# Patient Record
Sex: Female | Born: 1935 | Race: White | Hispanic: No | Smoking: Never smoker
Health system: Southern US, Community
[De-identification: ages and names within clinical notes are randomized; demographics above are authoritative.]

## PROBLEM LIST (undated history)

## (undated) DIAGNOSIS — I4891 Unspecified atrial fibrillation: Secondary | ICD-10-CM

## (undated) DIAGNOSIS — R609 Edema, unspecified: Secondary | ICD-10-CM

## (undated) DIAGNOSIS — F0390 Unspecified dementia without behavioral disturbance: Secondary | ICD-10-CM

## (undated) DIAGNOSIS — K219 Gastro-esophageal reflux disease without esophagitis: Secondary | ICD-10-CM

## (undated) DIAGNOSIS — E119 Type 2 diabetes mellitus without complications: Secondary | ICD-10-CM

## (undated) DIAGNOSIS — I10 Essential (primary) hypertension: Secondary | ICD-10-CM

## (undated) DIAGNOSIS — E785 Hyperlipidemia, unspecified: Secondary | ICD-10-CM

## (undated) HISTORY — PX: NO PAST SURGERIES: SHX2092

---

## 2003-10-22 ENCOUNTER — Emergency Department: Payer: Self-pay | Admitting: Emergency Medicine

## 2004-04-18 ENCOUNTER — Ambulatory Visit: Payer: Self-pay | Admitting: Internal Medicine

## 2004-04-23 ENCOUNTER — Ambulatory Visit: Payer: Self-pay | Admitting: Internal Medicine

## 2004-05-12 ENCOUNTER — Ambulatory Visit: Payer: Self-pay | Admitting: Family Medicine

## 2007-01-04 ENCOUNTER — Ambulatory Visit: Payer: Self-pay | Admitting: Internal Medicine

## 2007-08-02 ENCOUNTER — Ambulatory Visit: Payer: Self-pay | Admitting: Specialist

## 2009-01-17 ENCOUNTER — Ambulatory Visit: Payer: Self-pay | Admitting: Family Medicine

## 2010-03-14 ENCOUNTER — Emergency Department: Payer: Self-pay | Admitting: Emergency Medicine

## 2010-08-01 ENCOUNTER — Emergency Department: Payer: Self-pay | Admitting: Emergency Medicine

## 2012-02-19 LAB — COMPREHENSIVE METABOLIC PANEL
Albumin: 3.4 g/dL (ref 3.4–5.0)
Chloride: 102 mmol/L (ref 98–107)
Creatinine: 1.13 mg/dL (ref 0.60–1.30)
Glucose: 169 mg/dL — ABNORMAL HIGH (ref 65–99)
Potassium: 4.2 mmol/L (ref 3.5–5.1)
SGPT (ALT): 17 U/L (ref 12–78)
Sodium: 137 mmol/L (ref 136–145)

## 2012-02-19 LAB — CBC
HCT: 37.3 % (ref 35.0–47.0)
HGB: 12 g/dL (ref 12.0–16.0)
MCH: 28.3 pg (ref 26.0–34.0)
MCV: 88 fL (ref 80–100)
Platelet: 207 10*3/uL (ref 150–440)
RBC: 4.25 10*6/uL (ref 3.80–5.20)
RDW: 14.3 % (ref 11.5–14.5)
WBC: 10.4 10*3/uL (ref 3.6–11.0)

## 2012-02-19 LAB — CBC WITH DIFFERENTIAL/PLATELET
Basophil %: 0.3 %
Eosinophil #: 0.4 10*3/uL (ref 0.0–0.7)
Eosinophil %: 3.5 %
Lymphocyte #: 1.2 10*3/uL (ref 1.0–3.6)
Lymphocyte %: 11.4 %
Monocyte #: 0.7 x10 3/mm (ref 0.2–0.9)
Monocyte %: 7.2 %
Neutrophil %: 77.6 %

## 2012-02-19 LAB — PROTIME-INR
INR: 1
Prothrombin Time: 13.7 secs (ref 11.5–14.7)

## 2012-02-19 LAB — TROPONIN I: Troponin-I: <0.02 ng/mL

## 2012-02-20 LAB — RAPID URINE DRUG SCREEN, HOSP PERFORMED
Amphetamines, Ur Screen: NEGATIVE (ref ?–1000)
Barbiturates, Ur Screen: NEGATIVE (ref ?–200)
Benzodiazepine, Ur Scrn: NEGATIVE (ref ?–200)
Cannabinoid 50 Ng, Ur ~~LOC~~: NEGATIVE (ref ?–50)
Cocaine Metabolite,Ur ~~LOC~~: NEGATIVE (ref ?–300)
Opiate, Ur Screen: NEGATIVE (ref ?–300)

## 2012-02-20 LAB — URINALYSIS, COMPLETE
Blood: NEGATIVE
Ketone: NEGATIVE
RBC,UR: NONE SEEN /HPF (ref 0–5)
Specific Gravity: 1.01 (ref 1.003–1.030)
Squamous Epithelial: <1

## 2012-02-20 LAB — TROPONIN I
Troponin-I: 0.03 ng/mL
Troponin-I: <0.02 ng/mL

## 2012-02-21 ENCOUNTER — Inpatient Hospital Stay: Payer: Self-pay | Admitting: Internal Medicine

## 2012-02-21 LAB — BASIC METABOLIC PANEL
BUN: 19 mg/dL — ABNORMAL HIGH (ref 7–18)
Chloride: 101 mmol/L (ref 98–107)
Co2: 28 mmol/L (ref 21–32)
EGFR (African American): >60
Glucose: 131 mg/dL — ABNORMAL HIGH (ref 65–99)
Sodium: 136 mmol/L (ref 136–145)

## 2012-02-21 LAB — CBC WITH DIFFERENTIAL/PLATELET
Basophil #: 0.1 10*3/uL (ref 0.0–0.1)
Basophil %: 0.9 %
Eosinophil #: 0.3 10*3/uL (ref 0.0–0.7)
Eosinophil %: 3.9 %
HCT: 35.6 % (ref 35.0–47.0)
HGB: 11.6 g/dL — ABNORMAL LOW (ref 12.0–16.0)
Lymphocyte #: 1 10*3/uL (ref 1.0–3.6)
Lymphocyte %: 11.9 %
MCH: 28.6 pg (ref 26.0–34.0)
MCHC: 32.7 g/dL (ref 32.0–36.0)
MCV: 88 fL (ref 80–100)
Monocyte #: 0.7 x10 3/mm (ref 0.2–0.9)
Neutrophil #: 6.3 10*3/uL (ref 1.4–6.5)
Platelet: 185 10*3/uL (ref 150–440)
RBC: 4.06 10*6/uL (ref 3.80–5.20)
WBC: 8.4 10*3/uL (ref 3.6–11.0)

## 2012-02-21 LAB — LIPID PANEL
Cholesterol: 117 mg/dL (ref 0–200)
HDL Cholesterol: 38 mg/dL — ABNORMAL LOW (ref 40–60)
Ldl Cholesterol, Calc: 58 mg/dL (ref 0–100)

## 2012-02-28 ENCOUNTER — Emergency Department: Payer: Self-pay | Admitting: Emergency Medicine

## 2012-02-28 LAB — URINALYSIS, COMPLETE
Bilirubin,UR: NEGATIVE
Ketone: NEGATIVE
Leukocyte Esterase: NEGATIVE
Nitrite: NEGATIVE
Ph: 6 (ref 4.5–8.0)
Protein: 100
RBC,UR: 7521 /HPF (ref 0–5)
Squamous Epithelial: 11
WBC UR: 90 /HPF (ref 0–5)

## 2012-02-28 LAB — CBC
HCT: 36.7 % (ref 35.0–47.0)
MCH: 29.4 pg (ref 26.0–34.0)
MCHC: 33.9 g/dL (ref 32.0–36.0)
Platelet: 219 10*3/uL (ref 150–440)
RDW: 14.1 % (ref 11.5–14.5)
WBC: 9.5 10*3/uL (ref 3.6–11.0)

## 2012-02-28 LAB — COMPREHENSIVE METABOLIC PANEL
Albumin: 3.3 g/dL — ABNORMAL LOW (ref 3.4–5.0)
Alkaline Phosphatase: 77 U/L (ref 50–136)
BUN: 16 mg/dL (ref 7–18)
Bilirubin,Total: 0.6 mg/dL (ref 0.2–1.0)
Calcium, Total: 8.6 mg/dL (ref 8.5–10.1)
Chloride: 101 mmol/L (ref 98–107)
Osmolality: 279 (ref 275–301)
SGPT (ALT): 22 U/L (ref 12–78)
Sodium: 137 mmol/L (ref 136–145)

## 2012-03-01 LAB — URINE CULTURE

## 2012-11-19 ENCOUNTER — Observation Stay: Payer: Self-pay | Admitting: Internal Medicine

## 2012-11-19 LAB — COMPREHENSIVE METABOLIC PANEL
Albumin: 3.2 g/dL — ABNORMAL LOW (ref 3.4–5.0)
Alkaline Phosphatase: 99 U/L (ref 50–136)
Anion Gap: 6 — ABNORMAL LOW (ref 7–16)
BUN: 22 mg/dL — ABNORMAL HIGH (ref 7–18)
Calcium, Total: 8.5 mg/dL (ref 8.5–10.1)
Chloride: 101 mmol/L (ref 98–107)
Co2: 28 mmol/L (ref 21–32)
Creatinine: 0.98 mg/dL (ref 0.60–1.30)
EGFR (African American): 60 — ABNORMAL LOW
EGFR (Non-African Amer.): 52 — ABNORMAL LOW
Glucose: 297 mg/dL — ABNORMAL HIGH (ref 65–99)
SGOT(AST): 15 U/L (ref 15–37)
Total Protein: 6.6 g/dL (ref 6.4–8.2)

## 2012-11-19 LAB — CBC
HGB: 12.6 g/dL (ref 12.0–16.0)
MCH: 28.7 pg (ref 26.0–34.0)
MCV: 84 fL (ref 80–100)
Platelet: 200 10*3/uL (ref 150–440)
RBC: 4.37 10*6/uL (ref 3.80–5.20)

## 2012-11-19 LAB — TROPONIN I: Troponin-I: 0.02 ng/mL

## 2012-11-20 LAB — CBC WITH DIFFERENTIAL/PLATELET
Basophil #: 0.1 10*3/uL (ref 0.0–0.1)
HCT: 35.4 % (ref 35.0–47.0)
Lymphocyte %: 12.7 %
MCH: 28.8 pg (ref 26.0–34.0)
Monocyte #: 0.6 x10 3/mm (ref 0.2–0.9)
Monocyte %: 7 %
Neutrophil %: 75.6 %
Platelet: 186 10*3/uL (ref 150–440)
RBC: 4.26 10*6/uL (ref 3.80–5.20)
RDW: 14.4 % (ref 11.5–14.5)
WBC: 8.4 10*3/uL (ref 3.6–11.0)

## 2012-11-20 LAB — LIPID PANEL
Cholesterol: 159 mg/dL (ref 0–200)
HDL Cholesterol: 40 mg/dL (ref 40–60)
VLDL Cholesterol, Calc: 24 mg/dL (ref 5–40)

## 2012-11-20 LAB — BASIC METABOLIC PANEL
Anion Gap: 4 — ABNORMAL LOW (ref 7–16)
BUN: 18 mg/dL (ref 7–18)
Calcium, Total: 8.4 mg/dL — ABNORMAL LOW (ref 8.5–10.1)
Creatinine: 0.91 mg/dL (ref 0.60–1.30)
EGFR (African American): >60
EGFR (Non-African Amer.): 56 — ABNORMAL LOW
Osmolality: 282 (ref 275–301)
Sodium: 137 mmol/L (ref 136–145)

## 2012-11-20 LAB — URINALYSIS, COMPLETE
Bilirubin,UR: NEGATIVE
Blood: NEGATIVE
Glucose,UR: >=500 mg/dL (ref 0–75)
Ketone: NEGATIVE
Specific Gravity: 1.013 (ref 1.003–1.030)
Squamous Epithelial: <1
WBC UR: 9 /HPF (ref 0–5)

## 2012-11-20 LAB — MAGNESIUM
Magnesium: 1.4 mg/dL — ABNORMAL LOW
Magnesium: 1.9 mg/dL

## 2012-11-20 LAB — TSH: Thyroid Stimulating Horm: 3.83 u[IU]/mL

## 2012-11-22 LAB — URINE CULTURE

## 2013-03-22 ENCOUNTER — Emergency Department: Payer: Self-pay | Admitting: Emergency Medicine

## 2013-03-22 LAB — CBC WITH DIFFERENTIAL/PLATELET
BASOS PCT: 0.5 %
Basophil #: 0.1 10*3/uL (ref 0.0–0.1)
EOS ABS: 0.3 10*3/uL (ref 0.0–0.7)
Eosinophil %: 3.5 %
HCT: 32 % — AB (ref 35.0–47.0)
HGB: 10.9 g/dL — AB (ref 12.0–16.0)
LYMPHS PCT: 10.8 %
Lymphocyte #: 1 10*3/uL (ref 1.0–3.6)
MCH: 28.6 pg (ref 26.0–34.0)
MCHC: 34.1 g/dL (ref 32.0–36.0)
MCV: 84 fL (ref 80–100)
Monocyte #: 0.8 x10 3/mm (ref 0.2–0.9)
Monocyte %: 8 %
NEUTROS ABS: 7.3 10*3/uL — AB (ref 1.4–6.5)
NEUTROS PCT: 77.2 %
PLATELETS: 209 10*3/uL (ref 150–440)
RBC: 3.82 10*6/uL (ref 3.80–5.20)
RDW: 14.3 % (ref 11.5–14.5)
WBC: 9.4 10*3/uL (ref 3.6–11.0)

## 2013-03-23 LAB — URINALYSIS, COMPLETE
BLOOD: NEGATIVE
Bilirubin,UR: NEGATIVE
Glucose,UR: NEGATIVE mg/dL (ref 0–75)
Ketone: NEGATIVE
NITRITE: POSITIVE
PH: 6 (ref 4.5–8.0)
Protein: NEGATIVE
Specific Gravity: 1.015 (ref 1.003–1.030)
Squamous Epithelial: NONE SEEN
WBC UR: 4 /HPF (ref 0–5)

## 2013-03-23 LAB — COMPREHENSIVE METABOLIC PANEL
ALK PHOS: 71 U/L
Albumin: 2.9 g/dL — ABNORMAL LOW (ref 3.4–5.0)
Anion Gap: 4 — ABNORMAL LOW (ref 7–16)
BUN: 14 mg/dL (ref 7–18)
Bilirubin,Total: 0.3 mg/dL (ref 0.2–1.0)
CO2: 27 mmol/L (ref 21–32)
Calcium, Total: 8.4 mg/dL — ABNORMAL LOW (ref 8.5–10.1)
Chloride: 106 mmol/L (ref 98–107)
Creatinine: 0.91 mg/dL (ref 0.60–1.30)
GFR CALC NON AF AMER: 56 — AB
Glucose: 108 mg/dL — ABNORMAL HIGH (ref 65–99)
Osmolality: 275 (ref 275–301)
Potassium: 3.8 mmol/L (ref 3.5–5.1)
SGOT(AST): 16 U/L (ref 15–37)
SGPT (ALT): 13 U/L (ref 12–78)
Sodium: 137 mmol/L (ref 136–145)
Total Protein: 6.4 g/dL (ref 6.4–8.2)

## 2013-03-23 LAB — PROTIME-INR
INR: 1.5
Prothrombin Time: 17.4 secs — ABNORMAL HIGH (ref 11.5–14.7)

## 2013-03-23 LAB — CK TOTAL AND CKMB (NOT AT ARMC)
CK, TOTAL: 118 U/L
CK-MB: 1.6 ng/mL (ref 0.5–3.6)

## 2013-03-23 LAB — TROPONIN I: TROPONIN-I: 0.03 ng/mL

## 2013-03-25 LAB — URINE CULTURE

## 2013-08-31 ENCOUNTER — Emergency Department: Payer: Self-pay | Admitting: Emergency Medicine

## 2013-09-13 ENCOUNTER — Emergency Department: Payer: Self-pay | Admitting: Emergency Medicine

## 2014-05-04 NOTE — Discharge Summary (Signed)
PATIENT NAME:  Deborah Snyder, Deborah Snyder MR#:  [id number] DATE OF BIRTH:  1922-09-09  DATE OF ADMISSION:  11/19/2012 DATE OF DISCHARGE:  11/21/2012  PRIMARY CARE PROVIDER: Katina DungBarbara D. Dayna BarkerAldridge, MD  DISCHARGE DIAGNOSES: 1.  Transient ischemic attack with aphasia, resolved.  2.  Urinary tract infection.  3.  Diabetes mellitus.  4.  Hypertension.  5.  History of deep vein thrombosis.   IMAGING STUDIES: Include:  1.  A CT scan of the head without contrast which showed no acute intracranial abnormalities.  2.  Carotid Dopplers showed no significant stenosis.  3.  Echocardiogram showed normal ejection fraction, no significant valvular abnormalities, no thrombus.   ADMITTING HISTORY AND PHYSICAL AND HOSPITAL COURSE: Please see detailed H and P dictated previously by Dr. Jacques NavyAhmadzia. In brief, an 81 year old female patient with history of hypertension, diabetes, prior TIA, presented to the hospital complaining of acute aphasia, left-sided facial droop. This resolved by the time the patient was seen in the Emergency Room, was admitted to the hospitalist service.   HOSPITAL COURSE: 1.  TIA.  The patient did not have any acute stroke found on the CAT scan.  Carotid Dopplers and echocardiogram were normal without any source of emboli found. The patient, by the day of discharge, is doing well, has worked with physical therapy who have recommended home health with PT which has been set up.  2.  The patient will be on antibiotics for 3 more days after discharge for her UTI.    Today on examination, the patient's motor strength is 5/5 in upper extremities.   DISCHARGE MEDICATIONS: Include:  1.  Aspirin 81 mg daily.  2.  Donepezil 5 mg oral once a day.  3.  Fish oil 1000 mg oral once a day.  4.  Lasix 20 mg oral every other day.  5.  Humalog 3 units subcutaneous before dinner every day.  6.  Lantus 37 units subcutaneous once a day.  7.  Lisinopril 10 mg oral once a day.  8.  Metformin 500 mg oral once a day.  9.   Omeprazole 20 mg daily.  10.  Simvastatin 40 mg oral once a day.  11.  Xarelto 20 mg oral once a day.  12.  Latanoprost 1 drop to each eye once a day.  13.  Imodium 2 mg oral as needed for loose stools. No more than 8 doses in 24 hours.  14.  Tylenol 500 mg oral every 4 hours as needed.  15.  Milk of magnesia 30 mL oral once a day as needed.  16.  Zofran 8 mg oral every 8 hours as needed for nausea or vomiting.  17.  Ciprofloxacin 500 mg oral 2 times a day for 4 days.   DISCHARGE INSTRUCTIONS: The patient is being discharged home to follow up with Dr. Sherryll BurgerShah of neurology in 1 to 2 weeks for TIA and PCP in 1 to 2 weeks. Low-fat, low-salt diet. Activity as tolerated with assistance and walker. Physical therapy with home health has been set up.   TIME SPENT: On day of discharge in discharge activity was 40 minutes.   ____________________________ Molinda BailiffSrikar R. Kwesi Sangha, MD srs:cs D: 11/21/2012 14:34:00 ET T: 11/21/2012 15:06:32 ET JOB#: [id number]  cc: Katina DungBarbara D. Dayna BarkerAldridge, MD Thedore Pickel R. Oluwatosin Bracy, MD, <Dictator> Dr. Haydee MonicaShah   Roddy Bellamy R Leean Amezcua MD ELECTRONICALLY SIGNED 11/28/2012 20:45

## 2014-05-04 NOTE — H&P (Signed)
PATIENT NAME:  Deborah Snyder, Deborah Snyder MR#:  [id number] DATE OF BIRTH:  July 03, 1922  DATE OF ADMISSION:  11/19/2012  PRIMARY CARE PHYSICIAN: [facility]. Gavin Pot[facility] at [facility].   REFERRING PHYSICIAN: Dr. Lenard Lance.  PRIMARY CARDIOLOGIST: Dr. Darrold Junker.   CHIEF COMPLAINT: Episode of not talking and possible left-sided facial droop.   HISTORY OF PRESENT ILLNESS: The patient is a pleasant 81 year old female with advanced dementia,  history of hypertension, diabetes, history of TIA in the past, who was brought in by family after the patient experienced acute-onset episode of nonresponsiveness where she was awake but could not speak and was only tracking mildly with her eyes at about 6:30 p.m. EMS was called and the patient was brought in here. Currently, her symptoms are all resolved. The family thought that the patient possibly had left-sided facial droop as well, but they were not sure, but they state that she is back to normal. She had a CT of the head. which was negative for acute stroke, and hospitalist services were contacted for further evaluation and management.   PAST MEDICAL HISTORY: Hypertension with LVH, diabetes, hyperlipidemia, history of DVT earlier in the year, history of TIA several years ago, CAD status post PCI, history of renal artery stenosis status post angioplasty in 2002, history of left renal stent in 2006 at [facility], status post TAH.   SOCIAL HISTORY: Lives at [facility]. Walks with a walker currently. No alcohol or drug use per family.   FAMILY HISTORY: Diabetes in her father.   ALLERGIES: SULFA.   OUTPATIENT MEDICATIONS: Xarelto 20 mg daily, Lantus 37 units at bedtime, DocQlace 100 mg 3 times a day as needed, donepezil 5 mg daily, fish oil 1000 mg daily, furosemide 20 mg orally every day on even days, Humalog 3 units before dinner every day, Imodium as needed, cough medicine, latanoprost ophthalmic 0.005% solution 1 drop to each eye once a day, lisinopril 10 mg daily, Maalox p.r.n., metformin  500 mg daily, milk of magnesia p.r.n., omeprazole 20 mg daily, simvastatin 40 mg daily, Tylenol p.r.n., Zofran p.r.n.   REVIEW OF SYSTEMS:    CONSTITUTIONAL: The patient does have advanced dementia and cannot give reliable history, but the patient denies having any fevers, weight changes or pain anywhere. EYES: Denies blurry vision or double vision.  EARS, NOSE, THROAT: Denies tinnitus or hearing loss. No snoring.  RESPIRATORY: Denies cough, shortness of breath.  CARDIOVASCULAR: Denies chest pain or swelling in the legs. Has history of hypertension and CAD.  GASTROINTESTINAL: Denies nausea, vomiting, diarrhea, constipation, or black stools or bloody stools.  GENITOURINARY: Has some polyuria, which the daughters think is from excessive sweet tea. Denies any dysuria or painful urination.  HEMATOLOGIC AND LYMPHATIC: Denies anemia or easy bruising or bleeding.  SKIN: No rashes.  MUSCULOSKELETAL: Denies arthritis or gout.  NEUROLOGIC: Has history of TIA in the past and has dementia. No syncope.  PSYCHIATRIC: Has history of dementia.   PHYSICAL EXAMINATION: VITAL SIGNS: Temperature on arrival is 98, pulse 76, respiratory rate 14, blood pressure 193/99, O2 sat 96% on room air.  GENERAL: The patient is an obese female sitting in bed in no obvious distress.  HEENT: Normocephalic, atraumatic. Pupils are equal and reactive. Anicteric sclerae. Extraocular muscles intact. Moist mucous membranes.  NECK: Supple. No thyroid tenderness. No cervical lymphadenopathy. No JVD.  CARDIOVASCULAR: S1, S2, regularly regular. No significant murmurs appreciated.  LUNGS: Clear to auscultation, but the patient has poor respiratory effort. No crackles.  ABDOMEN: Soft, nontender, nondistended. Positive bowel sounds in all  quadrants.  EXTREMITIES: No significant lower extremity edema.  SKIN: No obvious rashes. NEUROLOGIC: Cranial nerves II through XII grossly intact. Strength is 5 out of 5 in all extremities.  Sensation is  intact to light touch.  PSYCHIATRIC: The patient is awake, alert, oriented to her family and knows she is in the hospital, not oriented to time.  LABORATORY, DIAGNOSTIC AND RADIOLOGICAL DATA: Glucose 295, BUN 22, creatinine 0.95, sodium 135, potassium 3.9. LFTs show albumin of 3.2, otherwise within normal limits. Troponins negative. White count of 9.8, hemoglobin 12.6, platelets 200. INR 1.1. CAT scan of the head without contrast: No acute intracranial abnormalities. EKG shows sinus rhythm with first-degree block with some PVCs and some T wave changes, which are also seen in the previous EKG.   ASSESSMENT AND PLAN: We have a pleasant 81 year old female with advanced dementia, hypertension, history of transient ischemic attack in the past, hyperlipidemia, coronary artery disease status post stenting, who comes in with acute-onset what appears to be aphasia and questionable left-sided facial droop and currently back to normal. All the symptoms are consistent with a transient ischemic attack and have resolved. At this point, we would admit the patient to the hospital. We would continue the Xarelto and the statin. Check ultrasound of the carotids and echocardiogram. She is back to normal. I would defer on getting an MRI at this point. The patient had a similar presentation in February and did get an MRI and echocardiogram and also carotid ultrasound. She is back to normal. We would monitor her with frequent neuro checks. Continue her blood pressure medications as well. In regards to her diabetes, I would go ahead and cut back on the Lantus, start sliding scale insulin, check a hemoglobin A1c. I would continue her Aricept. Hold the metformin. Continue her PPI. Obtain a physical therapy consult. Check a urinalysis and a culture for her polyuria. She does not have any significant fevers or leukocytosis to suggest she is having an acute infection. The patient is a full code.   TOTAL TIME SPENT: 45 minutes.     ____________________________ Krystal EatonShayiq Waddell Iten, MD sa:jcm D: 11/19/2012 20:42:57 ET T: 11/19/2012 21:04:57 ET JOB#: [id number]  cc: Krystal EatonShayiq Mary-Anne Polizzi, MD, <Dictator> Krystal EatonSHAYIQ Mija Effertz MD ELECTRONICALLY SIGNED 12/01/2012 7:04

## 2014-05-04 NOTE — H&P (Signed)
PATIENT NAME:  Deborah Snyder, Deborah Snyder MR#:  [id number] DATE OF BIRTH:  [birth year]  DATE OF ADMISSION:  02/20/2012  REFERRING PHYSICIAN: Rebecka Apley, MD  PRIMARY CARE PHYSICIAN: Katina Dung. Dayna Barker, MD  CHIEF COMPLAINT: Altered mental status and facial droop.   HISTORY OF PRESENT ILLNESS: This is an 81 year old female who lives at skilled nursing facility, with known history of advanced Alzheimer's dementia, hypertension, hyperlipidemia, diabetes mellitus, sinus bradycardia, who presents with an episode of unresponsiveness with altered mental status. The patient was last seen at 6:30 before she went to have a nap at the nursing home. They woke her up around 8:00 p.m. to give her her medication. The patient was noticed to be unresponsive. They tried to sit her up, where she slumped, which prompted them to bring her to the ED. In the ED, the patient was unresponsive, as well was noticed to have aphasia with facial droop, which resolved spontaneously, where she is back at her baseline. The patient had CT brain which did not show any acute finding as well. Her EKG as well did not show any significant changes, but it did show right bundle branch block which is old when compared to the previous EKG. The patient was given 324 of aspirin in the ED. The patient as well was noticed to have some left leg cellulitis and swelling as well. Hospitalist service was requested to admit the patient for further workup and management of her symptoms. The patient is an extremely poor historian secondary to her dementia. Daughter was at the bedside, where she gives most of the history of present illness, as well it was obtained from ED staff. The patient does not recall any of these findings, but she appears to be confused as well, which daughter reports this is her baseline secondary to her dementia.   PAST MEDICAL HISTORY:  1. Hypertension with LVH.  2. Diabetes mellitus.  3. Hyperlipidemia.  4. Coronary artery disease  status post PCI.  5. Renal artery stenosis status post angioplasty in 2002 by Dr. Prentiss Bells.  6. Left renal stent in 2006 at [facility], status post TAH.   SOCIAL HISTORY: The patient is a nursing home resident. There is no history of tobacco or alcohol abuse.   FAMILY HISTORY: Significant for diabetes in her father.   ALLERGIES: SULFA, NORVASC.   HOME MEDICATIONS:  1. Lisinopril 10 mg oral daily.  2. Lantus 22 units at bedtime.  3. Metformin 500 mg 2 times a day.  4. Aspirin 81 mg daily.  5. Simvastatin 40 mg at bedtime.  6. Lasix 20 mg oral daily.  7. Aricept 5 mg oral at bedtime.  8. Namenda 10 mg oral 2 times a day.  9. Fish oil capsules 1000 mg oral daily.  10. Latanoprost ophthalmic eyedrops both eyes at bedtime 1 drop. 11. Omeprazole 20 mg oral daily.  12. VESIcare 10 mg oral daily.  13. Certa-Vite with antioxidant 1 tablet oral daily.   REVIEW OF SYSTEMS: ,  GENERAL: As mentioned earlier, the patient has advanced dementia and is confused, cannot give reliable review of systems, but generally, she denies any weakness, fatigue, fever or chills. EYES: Denies blurry vision, double vision, inflammation or glaucoma.  ENT: Denies tinnitus, ear pain, hearing loss, epistaxis or discharge.  RESPIRATORY: Denies cough, wheezing, hemoptysis, dyspnea or syncope (even though she had witnessed episode of unresponsiveness, she denies that).  GASTROINTESTINAL: Denies nausea, vomiting, diarrhea, abdominal pain, hematemesis, jaundice, rectal bleed.  GENITOURINARY: Denies dysuria, hematuria, renal colic.  ENDOCRINE: Denies polyuria, polydipsia, heat or cold intolerance.  HEMATOLOGY: Denies anemia, easy bruising, bleeding diathesis.  INTEGUMENTARY: Reports rash in the left lower extremity.  MUSCULOSKELETAL: Reports she has been progressively weaker, where she is currently using a walker. Denies any gout, cramps.  NEUROLOGIC: The patient's main complaint is some tingling and numbness in the right lower  extremity. She denies any weakness, slurred speech, ataxia, headache, dementia, even though she is known to have history of Alzheimer's demential, and she had witnessed syncope with left facial droop.  PSYCHIATRIC: History of dementia. Denies anxiety, insomnia, nervousness.   PHYSICAL EXAMINATION:  VITAL SIGNS: Temperature 98.4, pulse 74, respiratory rate 18, blood pressure 148/60, saturating 96% on room air.  GENERAL: Elderly female who looks comfortable in bed, in no apparent distress.  HEENT: Head atraumatic, normocephalic. Pupils equally reactive to light. Pink conjunctivae. Anicteric sclerae. Moist oral mucosa.  NECK: Supple. No thyromegaly. No JVD.  CHEST: Good air entry bilaterally. No wheezing, rales or rhonchi.  CARDIOVASCULAR: S1, S2 heard. No rubs, murmurs or gallops.  ABDOMEN: Soft, nontender, nondistended. Bowel sounds present.  EXTREMITIES: Right lower extremity: No edema. No clubbing. No cyanosis. The left lower extremity has some rash on the anterior surface and appears to be psoriatic rash with some tenderness and erythema. As well, had some swelling +1 in the left lower extremity.  PSYCHIATRIC: The patient appears to be pleasant, communicative, but she is confused. Awake, alert x1 to 2.  NEUROLOGIC: Cranial nerves grossly intact. Motor 5 out of 5 in all extremities. No significant deficits. Sensation is symmetrical and intact.  SKIN: As mentioned earlier, has left lower extremity cellulitis.   PERTINENT LABORATORY: Glucose 169, BUN 19, creatinine 1.13, sodium 137, potassium 4.2, chloride 102, CO2 27. Troponin less than 0.02. White blood cells 10.4, hemoglobin 12, hematocrit 37.3, platelets 207. INR 1. Urinalysis negative for nitrite, +1 leukocyte esterase, white blood cells 3. CT of brain: No acute intracranial abnormality. EKG showing normal sinus rhythm with a right bundle branch block, which is old when compared to previous. EKG. As well, has T wave inversion in V2, V3, V4, which  is old when compared to old EKG.   ASSESSMENT AND PLAN:  1. Episode of unresponsiveness with left facial droop. It appears that all these symptoms are currently resolved. This is most likely transient ischemic attack versus syncope. The patient will be admitted with telemetry monitor. Will continue to cycle her troponins. Will check 2-D echo as well. Will need to evaluate for transient ischemic attack, so will check MRI of the brain and check carotid Dopplers and will consult physical therapy. The patient is already on aspirin and statin.  2. Left leg cellulitis. Will be started on Levaquin.  3. Left leg swelling. Will check venous Doppler.  4. Dementia. Will continue Aricept and Namenda. 5. Hypertension, acceptable. Continue home medication.  6. Diabetes mellitus. Will continue with Lantus and insulin sliding scale. Will hold metformin. 7. Hyperlipidemia. The patient is on statin.  8. History of coronary artery disease. The patient denies chest pain. Denies shortness of breath. Is status post PCI. Will continue on aspirin, statin and ACE inhibitor. No beta blocker secondary to her history of sinus bradycardia.  9. Deep vein thrombosis prophylaxis. Subcutaneous heparin.  10. Gastrointestinal prophylaxis. Protonix.   CODE STATUS: Full code. Discussed with family at bedside.   TOTAL TIME SPENT ON ADMISSION AND PATIENT CARE: 60 minutes.   ____________________________ Starleen Armsawood S. Dorsel Flinn, MD dse:OSi D: 02/20/2012 03:17:21 ET T: 02/20/2012 08:40:32 ET  JOB#: [id number]  cc: Starleen Arms, MD, <Dictator> Tomara Youngberg Teena Irani MD ELECTRONICALLY SIGNED 02/21/2012 2:01

## 2014-05-04 NOTE — Discharge Summary (Signed)
PATIENT NAME:  Deborah Snyder, Deborah Snyder MR#:  [id number] DATE OF BIRTH:  [birth year]  DATE OF ADMISSION:  02/21/2012 DATE OF DISCHARGE:  02/22/2012 PRIMARY CARE PHYSICIAN:  Katina Dung. Dayna Barker, MD   FINAL DIAGNOSES: 1.  Deep vein thrombosis left lower extremity.  2.  Encephalopathy, which improved.  3.  Possible cellulitis of the left lower extremity and possible urinary tract infection.  4.  Diabetes.  5.  Hypertension.  6.  Dementia.  7.  Constipation.  8.  Gastroesophageal reflux disease.  9.  Glaucoma.   MEDICATIONS ON DISCHARGE: Include:  Lisinopril 10 mg daily, Namenda 10 mg twice a day, latanoprost ophthalmic solution 1 drop both eyes at bedtime 0.005%, fish oil 300 mg to 1000 mg caps daily, CertaVite with antioxidants oral tablet once a day, benazepril 5 mg at bedtime, metformin 500 mg 2 tablets twice a day, simvastatin 40 mg at bedtime, Lantus insulin 22 units subcutaneous injection daily at bedtime, omeprazole 20 mg daily, Lasix 20 mg 1/2 tablet every other day, levofloxacin 250 mg once a day for 5 days then stop. Rivaroxaban 15 mg; this is 1 tablet twice a day for 19 days through March 1,  then starting March 2 start 20 mg p.o. daily for 5 months.  Stop taking aspirin.  Home health, physical therapy and nurse.   DIET: Low sodium diet.   ACTIVITY: Activity as tolerated.   REFERRAL: Physical therapy. He must use a walker with walking and assistance while on blood thinner.   REASON FOR ADMISSION: The patient was admitted 02/20/2012, admitted with altered mental status and facial droop. The symptoms resolved. The patient was admitted as a possible TIA versus syncope. An MRI of the brain, carotid ultrasound was ordered, was started on Levaquin for left lower extremity cellulitis and a venous Doppler was ordered.   HOSPITAL LABORATORY, DIAGNOSTIC AND RADIOLOGICAL DATA DURING THE HOSPITAL COURSE: Included a troponin that was negative. Glucose 169, BUN 19, creatinine 1.13, sodium 137, potassium  4.2, chloride 102, CO2 of 27, calcium 8.9. Liver function tests normal. INR 1.0. White blood cell count 10.4, hemoglobin and hematocrit 12.0 and 37.3, platelet count 207.   EKG showed sinus rhythm, first-degree AV block, PVC.   CT scan of the head showed no acute abnormality.   Chest x-ray showed no acute abnormality.   Urinalysis: 1+ leukocyte esterase. Urine toxicology negative.   Ultrasound of the left lower extremity showed positive DVT. Ultrasound of the carotids showed no hemodynamically significant stenosis.   Echocardiogram showed normal left ventricular function, LVH, mild to moderate MR, mild TR.  LDL 58, HDL 38, triglycerides 105.   Urine culture grew out colonies too small to read, but that was actually sent off on the 9th. Creatinine upon discharge 0.97   MRI of the brain showed no acute intracranial abnormality.   HOSPITAL COURSE PER PROBLEM LIST:  1.  For the deep vein thrombosis in the left lower extremity, the patient was started on Xarelto.  I started 15 mg twice a day. That will be for another 19 days and then be switched to 20 mg daily. I did speak with the vascular surgery, Dr. Gilda Crease,  about this case; and with the location of the DVT, anticoagulation trial will be underway.  The longer the patient can stay on it, the better. I am concerned about her with dementia with the blood thinner. Benefits and risks of the Xarelto were spoken with the daughter and we are going to try to do anticoagulation at this  time. I do recommend walking with walker and with physical therapy and with assistance.  2.  For the encephalopathy, I am not quite sure what that episode was, but it sort of cleared. I do think the patient does have underlying dementia.  I am not sure if there was an infection on this or not.  3.  Possible cellulitis and possible urinary tract infection. The patient will be continued on a course of the Levaquin.  4. Diabetes. The patient is on Lantus and metformin.  Careful with metformin with the patient's age; can consider stopping that.  5.  Hypertension. Blood pressure 117/47 upon discharge.  6.  Dementia. The patient is on Namenda and Aricept.  7.  Constipation. Continue to monitor her for bowel movements as outpatient.  8.  Gastroesophageal reflux disease, on PPI.  9.  Glaucoma, on latanoprost.    TIME SPENT ON DISCHARGE: 35 minutes.     ____________________________ Herschell Dimesichard J. Renae GlossWieting, MD rjw:cc D: 02/22/2012 17:02:05 ET T: 02/22/2012 23:40:30 ET JOB#: [id number]  cc: Herschell Dimesichard J. Renae GlossWieting, MD, <Dictator> Katina DungBarbara D. Dayna BarkerAldridge, MD  Salley ScarletICHARD J Siah Kannan MD ELECTRONICALLY SIGNED 03/02/2012 13:34

## 2014-08-31 ENCOUNTER — Ambulatory Visit
Admission: EM | Admit: 2014-08-31 | Discharge: 2014-08-31 | Disposition: A | Discharge status: Home / Self Care | Payer: Medicare Other | Attending: Family Medicine | Admitting: Family Medicine

## 2014-08-31 DIAGNOSIS — K047 Periapical abscess without sinus: Secondary | ICD-10-CM | POA: Diagnosis not present

## 2014-08-31 HISTORY — DX: Essential (primary) hypertension: I10

## 2014-08-31 HISTORY — DX: Unspecified dementia, unspecified severity, without behavioral disturbance, psychotic disturbance, mood disturbance, and anxiety: F03.90

## 2014-08-31 HISTORY — DX: Gastro-esophageal reflux disease without esophagitis: K21.9

## 2014-08-31 HISTORY — DX: Hyperlipidemia, unspecified: E78.5

## 2014-08-31 HISTORY — DX: Edema, unspecified: R60.9

## 2014-08-31 HISTORY — DX: Type 2 diabetes mellitus without complications: E11.9

## 2014-08-31 MED ORDER — HYDROCODONE-ACETAMINOPHEN 5-325 MG PO TABS: 0.5000 | ORAL_TABLET | Freq: Four times a day (QID) | ORAL | Status: DC | PRN

## 2014-08-31 MED ORDER — AMOXICILLIN-POT CLAVULANATE 875-125 MG PO TABS: 1.0000 | ORAL_TABLET | Freq: Two times a day (BID) | ORAL | Status: DC

## 2014-08-31 NOTE — ED Notes (Signed)
Patient states that she is not currently in any pain but, she has notable swelling in right face. Patient is cognitive deficient. Patient caregiver reports "pus was in her mouth when brushing her teeth this morning." She states that patient did complain about pain earlier in the day but, area looks to be getting worse now.

## 2014-08-31 NOTE — ED Provider Notes (Signed)
CSN: [id number]     Arrival date & time 08/31/14  1844 History   First MD Initiated Contact with Patient 08/31/14 1955     Chief Complaint  Patient presents with  . Dental Pain   (Consider location/radiation/quality/duration/timing/severity/associated sxs/prior Treatment) HPI   This is a 81 year old female who is accompanied by her daughter presenting with swelling and pain in the left lower jaw. The daughter this evening was notified by [facility] of her mother being in pain and having some pus in her mouth. Her teeth are in poor repair. Her granddaughter is a Education officer, community but unfortunately was [place] the beach and was unable to see her today. Will see her on Monday. The patient has advanced dementia and does not complain of anything at the present time. He has diabetes on insulin.  Past Medical History  Diagnosis Date  . Dementia   . Edema   . Diabetes   . Hypertension   . GERD (gastroesophageal reflux disease)   . Hyperlipidemia    No past surgical history on file. No family history on file. Social History  Substance Use Topics  . Smoking status: Never Smoker   . Smokeless tobacco: None  . Alcohol Use: No   OB History    No data available     Review of Systems  HENT: Positive for dental problem.   All other systems reviewed and are negative.   Allergies  Sulfa antibiotics  Home Medications   Prior to Admission medications   Medication Sig Start Date End Date Taking? Authorizing Provider  docusate sodium (COLACE) 100 MG capsule Take 100 mg by mouth 2 (two) times daily.   Yes Historical Provider, MD  donepezil (ARICEPT) 5 MG tablet Take 5 mg by mouth at bedtime.   Yes Historical Provider, MD  fluorouracil (EFUDEX) 5 % cream Apply topically 2 (two) times daily.   Yes Historical Provider, MD  furosemide (LASIX) 20 MG tablet Take 20 mg by mouth.   Yes Historical Provider, MD  insulin glargine (LANTUS) 100 UNIT/ML injection Inject 42 Units into the skin at bedtime.   Yes  Historical Provider, MD  insulin lispro (HUMALOG) 100 UNIT/ML injection Inject into the skin 3 (three) times daily before meals.   Yes Historical Provider, MD  latanoprost (XALATAN) 0.005 % ophthalmic solution 1 drop at bedtime.   Yes Historical Provider, MD  lisinopril (PRINIVIL,ZESTRIL) 10 MG tablet Take 10 mg by mouth daily.   Yes Historical Provider, MD  loperamide (IMODIUM) 2 MG capsule Take 2 mg by mouth as needed for diarrhea or loose stools.   Yes Historical Provider, MD  metFORMIN (GLUMETZA) 500 MG (MOD) 24 hr tablet Take 500 mg by mouth daily with breakfast.   Yes Historical Provider, MD  Omega-3 Fatty Acids (FISH OIL) 1200 MG CAPS Take by mouth.   Yes Historical Provider, MD  omeprazole (PRILOSEC) 20 MG capsule Take 20 mg by mouth daily.   Yes Historical Provider, MD  potassium citrate (UROCIT-K) 10 MEQ (1080 MG) SR tablet Take 10 mEq by mouth 3 (three) times daily with meals.   Yes Historical Provider, MD  rivaroxaban (XARELTO) 20 MG TABS tablet Take 20 mg by mouth daily with supper.   Yes Historical Provider, MD  simvastatin (ZOCOR) 40 MG tablet Take 40 mg by mouth daily.   Yes Historical Provider, MD  amoxicillin-clavulanate (AUGMENTIN) 875-125 MG per tablet Take 1 tablet by mouth every 12 (twelve) hours. 08/31/14   Lutricia Feil, PA-C  HYDROcodone-acetaminophen (NORCO/VICODIN) 5-325 MG  per tablet Take 0.5-1 tablets by mouth every 6 (six) hours as needed for moderate pain or severe pain. 08/31/14   Chrissie Noa Emmalee Solivan, PA-C   BP 165/93 mmHg  Pulse 60  Temp(Src) 98.9 F (37.2 C) (Oral)  Resp 18  SpO2 95% Physical Exam  Constitutional: She appears well-developed and well-nourished.  HENT:  Head: Normocephalic and atraumatic.  Examination the mouth shows the teeth to be in poor repair. Is missing many teeth on the left lower and on the right there is a noticeable swelling of the gum at the first molar area. Is is tender and the patient does react to pressure. Is no facial swelling at  the present time and the jaw does not appear to be swollen. Is no cervical adenopathy present. Patient is afebrile. She did have some debris in the gum recess but it was not purulent. She is alert and does not appear ill or toxic.  Eyes: Pupils are equal, round, and reactive to light.  Neck: Neck supple.  Lymphadenopathy:    She has no cervical adenopathy.  Nursing note and vitals reviewed.   ED Course  Procedures (including critical care time) Labs Review Labs Reviewed - No data to display  Imaging Review No results found.   MDM   1. Dental abscess    New Prescriptions   AMOXICILLIN-CLAVULANATE (AUGMENTIN) 875-125 MG PER TABLET    Take 1 tablet by mouth every 12 (twelve) hours.   HYDROCODONE-ACETAMINOPHEN (NORCO/VICODIN) 5-325 MG PER TABLET    Take 0.5-1 tablets by mouth every 6 (six) hours as needed for moderate pain or severe pain.   Plan: 1.Diagnosis reviewed with patient 2. rx as per orders; risks, benefits, potential side effects reviewed with patient 3. Recommend supportive treatment with rest,ibuprofen 4. F/u with dentist next week    Lutricia Feil, PA-C 08/31/14 2022

## 2014-08-31 NOTE — Discharge Instructions (Signed)

## 2015-05-19 ENCOUNTER — Ambulatory Visit
Admission: EM | Admit: 2015-05-19 | Discharge: 2015-05-19 | Disposition: A | Discharge status: Home / Self Care | Payer: Medicare Other | Attending: Family Medicine | Admitting: Family Medicine

## 2015-05-19 DIAGNOSIS — L02214 Cutaneous abscess of groin: Secondary | ICD-10-CM

## 2015-05-19 MED ORDER — DOXYCYCLINE HYCLATE 100 MG PO CAPS: 100.0000 mg | ORAL_CAPSULE | Freq: Two times a day (BID) | ORAL | Status: DC

## 2015-05-19 NOTE — ED Provider Notes (Signed)
CSN: [id number]     Arrival date & time 05/19/15  1126 History   None    Chief Complaint  Patient presents with  . Vaginal Bleeding   (Consider location/radiation/quality/duration/timing/severity/associated sxs/prior Treatment) HPI  82 year old female with a past medical history of dementia, diabetes, hypertension, hyperlipidemia presents for evaluation regarding possible cyst or abscess in her genital area.  The daughter is the primary historian as patient has dementia. Per the daughter, it was brought to her attention this morning that she had blood on her sanitary pad. The nurses at her facility took a look at the area and were concerned about a cyst/abscess. No reports of fever. No new medication changes. No changes in her mental status. This area has not been treated or evaluated by physician. No known exacerbating or relieving factors.  Past Medical History  Diagnosis Date  . Dementia   . Edema   . Diabetes (HCC)   . Hypertension   . GERD (gastroesophageal reflux disease)   . Hyperlipidemia    History reviewed. No pertinent past surgical history.   History reviewed. No pertinent family history.   Social History  Substance Use Topics  . Smoking status: Never Smoker   . Smokeless tobacco: None  . Alcohol Use: No   OB History    No data available     Review of Systems  Constitutional: Negative.   Skin: Positive for wound.    Allergies  Sulfa antibiotics  Home Medications   Prior to Admission medications   Medication Sig Start Date End Date Taking? Authorizing Provider  fluorouracil (EFUDEX) 5 % cream Apply topically 2 (two) times daily.   Yes Historical Provider, MD  furosemide (LASIX) 20 MG tablet Take 20 mg by mouth.   Yes Historical Provider, MD  insulin glargine (LANTUS) 100 UNIT/ML injection Inject 42 Units into the skin at bedtime.   Yes Historical Provider, MD  insulin lispro (HUMALOG) 100 UNIT/ML injection Inject into the skin 3 (three) times daily before  meals.   Yes Historical Provider, MD  latanoprost (XALATAN) 0.005 % ophthalmic solution 1 drop at bedtime.   Yes Historical Provider, MD  lisinopril (PRINIVIL,ZESTRIL) 10 MG tablet Take 10 mg by mouth daily.   Yes Historical Provider, MD  metFORMIN (GLUMETZA) 500 MG (MOD) 24 hr tablet Take 500 mg by mouth daily with breakfast.   Yes Historical Provider, MD  Omega-3 Fatty Acids (FISH OIL) 1200 MG CAPS Take by mouth.   Yes Historical Provider, MD  rivaroxaban (XARELTO) 20 MG TABS tablet Take 20 mg by mouth daily with supper.   Yes Historical Provider, MD  simvastatin (ZOCOR) 40 MG tablet Take 40 mg by mouth daily.   Yes Historical Provider, MD  amoxicillin-clavulanate (AUGMENTIN) 875-125 MG per tablet Take 1 tablet by mouth every 12 (twelve) hours. 08/31/14   Lutricia FeilWilliam P Roemer, PA-C  docusate sodium (COLACE) 100 MG capsule Take 100 mg by mouth 2 (two) times daily.    Historical Provider, MD  donepezil (ARICEPT) 5 MG tablet Take 5 mg by mouth at bedtime.    Historical Provider, MD  doxycycline (VIBRAMYCIN) 100 MG capsule Take 1 capsule (100 mg total) by mouth 2 (two) times daily. 05/19/15   Tommie SamsJayce G Meriem Lemieux, DO  HYDROcodone-acetaminophen (NORCO/VICODIN) 5-325 MG per tablet Take 0.5-1 tablets by mouth every 6 (six) hours as needed for moderate pain or severe pain. 08/31/14   Lutricia FeilWilliam P Roemer, PA-C  loperamide (IMODIUM) 2 MG capsule Take 2 mg by mouth as needed for diarrhea or  loose stools.    Historical Provider, MD  omeprazole (PRILOSEC) 20 MG capsule Take 20 mg by mouth daily.    Historical Provider, MD  potassium citrate (UROCIT-K) 10 MEQ (1080 MG) SR tablet Take 10 mEq by mouth 3 (three) times daily with meals.    Historical Provider, MD   Meds Ordered and Administered this Visit  Medications - No data to display  BP 121/66 mmHg  Pulse 61  Temp(Src) 97.5 F (36.4 C) (Oral)  Resp 20  Ht  (1.676 m)  Wt 200 lb (90.719 kg)  BMI 32.30 kg/m2  SpO2 99% No data found.  Physical Exam   Constitutional: No distress.  Cardiovascular:  Murmur heard. Irregular, secondary to ectopy.  Pulmonary/Chest: Effort normal.  Abdominal: Soft. She exhibits no distension. There is no tenderness. There is no rebound and no guarding.  Neurological: She is alert.  Skin:  L inguinal region with abscess. Large area of induration. No fluctuance noted. Surrounding erythema.  Psychiatric: She has a normal mood and affect.  Vitals reviewed.  ED Course  Procedures (including critical care time)  Labs Review Labs Reviewed - No data to display  Imaging Review No results found.  MDM   1. Inguinal abscess    82 year old female presents with an inguinal abscess. Given location, size of induration, and comorbidities I elected to proceed with an oral course of doxycycline. She is to follow-up later this week for reevaluation. If no improvement, will need to see a surgeon for drainage.   Tommie Sams, DO 05/19/15 1238

## 2015-05-19 NOTE — Discharge Instructions (Signed)
Take the antibiotic (Doxycycline) as prescribed. Needs to be reevaluated later this week.  Abscess An abscess is an infected area that contains a collection of pus and debris.It can occur in almost any part of the body. An abscess is also known as a furuncle or boil. CAUSES  An abscess occurs when tissue gets infected. This can occur from blockage of oil or sweat glands, infection of hair follicles, or a minor injury to the skin. As the body tries to fight the infection, pus collects in the area and creates pressure under the skin. This pressure causes pain. People with weakened immune systems have difficulty fighting infections and get certain abscesses more often.  SYMPTOMS Usually an abscess develops on the skin and becomes a painful mass that is red, warm, and tender. If the abscess forms under the skin, you may feel a moveable soft area under the skin. Some abscesses break open (rupture) on their own, but most will continue to get worse without care. The infection can spread deeper into the body and eventually into the bloodstream, causing you to feel ill.  DIAGNOSIS  Your caregiver will take your medical history and perform a physical exam. A sample of fluid may also be taken from the abscess to determine what is causing your infection. TREATMENT  Your caregiver may prescribe antibiotic medicines to fight the infection. However, taking antibiotics alone usually does not cure an abscess. Your caregiver may need to make a small cut (incision) in the abscess to drain the pus. In some cases, gauze is packed into the abscess to reduce pain and to continue draining the area. HOME CARE INSTRUCTIONS   Only take over-the-counter or prescription medicines for pain, discomfort, or fever as directed by your caregiver.  If you were prescribed antibiotics, take them as directed. Finish them even if you start to feel better.  If gauze is used, follow your caregiver's directions for changing the  gauze.  To avoid spreading the infection:  Keep your draining abscess covered with a bandage.  Wash your hands well.  Do not share personal care items, towels, or whirlpools with others.  Avoid skin contact with others.  Keep your skin and clothes clean around the abscess.  Keep all follow-up appointments as directed by your caregiver. SEEK MEDICAL CARE IF:   You have increased pain, swelling, redness, fluid drainage, or bleeding.  You have muscle aches, chills, or a general ill feeling.  You have a fever. MAKE SURE YOU:   Understand these instructions.  Will watch your condition.  Will get help right away if you are not doing well or get worse.   This information is not intended to replace advice given to you by your health care provider. Make sure you discuss any questions you have with your health care provider.   Document Released: 10/08/2004 Document Revised: 06/30/2011 Document Reviewed: 03/13/2011 Elsevier Interactive Patient Education [employer]2016 Elsevier Inc.

## 2015-06-22 ENCOUNTER — Emergency Department: Payer: Medicare Other

## 2015-06-22 ENCOUNTER — Inpatient Hospital Stay
Admission: EM | Admit: 2015-06-22 | Discharge: 2015-06-26 | DRG: 872 | Disposition: A | Discharge status: Skilled Nursing Facility | Payer: Medicare Other | Attending: Internal Medicine | Admitting: Internal Medicine

## 2015-06-22 ENCOUNTER — Encounter: Payer: Self-pay | Admitting: Emergency Medicine

## 2015-06-22 DIAGNOSIS — B961 Klebsiella pneumoniae [K. pneumoniae] as the cause of diseases classified elsewhere: Secondary | ICD-10-CM | POA: Diagnosis present

## 2015-06-22 DIAGNOSIS — A419 Sepsis, unspecified organism: Secondary | ICD-10-CM | POA: Diagnosis not present

## 2015-06-22 DIAGNOSIS — R509 Fever, unspecified: Secondary | ICD-10-CM | POA: Diagnosis not present

## 2015-06-22 DIAGNOSIS — R000 Tachycardia, unspecified: Secondary | ICD-10-CM

## 2015-06-22 DIAGNOSIS — J45909 Unspecified asthma, uncomplicated: Secondary | ICD-10-CM | POA: Diagnosis present

## 2015-06-22 DIAGNOSIS — R6510 Systemic inflammatory response syndrome (SIRS) of non-infectious origin without acute organ dysfunction: Secondary | ICD-10-CM | POA: Diagnosis present

## 2015-06-22 DIAGNOSIS — Z7984 Long term (current) use of oral hypoglycemic drugs: Secondary | ICD-10-CM

## 2015-06-22 DIAGNOSIS — K219 Gastro-esophageal reflux disease without esophagitis: Secondary | ICD-10-CM | POA: Diagnosis present

## 2015-06-22 DIAGNOSIS — I10 Essential (primary) hypertension: Secondary | ICD-10-CM | POA: Diagnosis present

## 2015-06-22 DIAGNOSIS — G309 Alzheimer's disease, unspecified: Secondary | ICD-10-CM | POA: Diagnosis present

## 2015-06-22 DIAGNOSIS — Z79899 Other long term (current) drug therapy: Secondary | ICD-10-CM

## 2015-06-22 DIAGNOSIS — F0280 Dementia in other diseases classified elsewhere without behavioral disturbance: Secondary | ICD-10-CM | POA: Diagnosis present

## 2015-06-22 DIAGNOSIS — Z794 Long term (current) use of insulin: Secondary | ICD-10-CM

## 2015-06-22 DIAGNOSIS — N390 Urinary tract infection, site not specified: Secondary | ICD-10-CM | POA: Diagnosis present

## 2015-06-22 DIAGNOSIS — E785 Hyperlipidemia, unspecified: Secondary | ICD-10-CM | POA: Diagnosis present

## 2015-06-22 DIAGNOSIS — I482 Chronic atrial fibrillation: Secondary | ICD-10-CM | POA: Diagnosis present

## 2015-06-22 DIAGNOSIS — E119 Type 2 diabetes mellitus without complications: Secondary | ICD-10-CM | POA: Diagnosis present

## 2015-06-22 DIAGNOSIS — I214 Non-ST elevation (NSTEMI) myocardial infarction: Secondary | ICD-10-CM

## 2015-06-22 HISTORY — DX: Unspecified atrial fibrillation: I48.91

## 2015-06-22 LAB — COMPREHENSIVE METABOLIC PANEL
ALBUMIN: 3.6 g/dL (ref 3.5–5.0)
ALK PHOS: 64 U/L (ref 38–126)
ALT: 31 U/L (ref 14–54)
ANION GAP: 8 (ref 5–15)
AST: 37 U/L (ref 15–41)
BILIRUBIN TOTAL: 1.8 mg/dL — AB (ref 0.3–1.2)
BUN: 26 mg/dL — ABNORMAL HIGH (ref 6–20)
CALCIUM: 8.4 mg/dL — AB (ref 8.9–10.3)
CO2: 24 mmol/L (ref 22–32)
Chloride: 101 mmol/L (ref 101–111)
Creatinine, Ser: 1.11 mg/dL — ABNORMAL HIGH (ref 0.44–1.00)
GFR, EST AFRICAN AMERICAN: 49 mL/min — AB (ref >60)
GFR, EST NON AFRICAN AMERICAN: 42 mL/min — AB (ref >60)
GLUCOSE: 288 mg/dL — AB (ref 65–99)
POTASSIUM: 4.1 mmol/L (ref 3.5–5.1)
Sodium: 133 mmol/L — ABNORMAL LOW (ref 135–145)
TOTAL PROTEIN: 6.7 g/dL (ref 6.5–8.1)

## 2015-06-22 LAB — URINALYSIS COMPLETE WITH MICROSCOPIC (ARMC ONLY)
Bilirubin Urine: NEGATIVE
Glucose, UA: >500 mg/dL — AB
LEUKOCYTES UA: NEGATIVE
NITRITE: POSITIVE — AB
PROTEIN: 100 mg/dL — AB
SPECIFIC GRAVITY, URINE: 1.025 (ref 1.005–1.030)
pH: 5 (ref 5.0–8.0)

## 2015-06-22 LAB — CBC WITH DIFFERENTIAL/PLATELET
Basophils Absolute: 0 10*3/uL (ref 0–0.1)
Basophils Relative: 0 %
Eosinophils Absolute: 0 10*3/uL (ref 0–0.7)
Eosinophils Relative: 0 %
HEMATOCRIT: 32.6 % — AB (ref 35.0–47.0)
HEMOGLOBIN: 10.8 g/dL — AB (ref 12.0–16.0)
LYMPHS ABS: 0.3 10*3/uL — AB (ref 1.0–3.6)
Lymphocytes Relative: 3 %
MCH: 28.4 pg (ref 26.0–34.0)
MCHC: 33.2 g/dL (ref 32.0–36.0)
MCV: 85.7 fL (ref 80.0–100.0)
MONO ABS: 0.8 10*3/uL (ref 0.2–0.9)
NEUTROS ABS: 11.7 10*3/uL — AB (ref 1.4–6.5)
Platelets: 159 10*3/uL (ref 150–440)
RBC: 3.81 MIL/uL (ref 3.80–5.20)
RDW: 14.9 % — AB (ref 11.5–14.5)
WBC: 12.8 10*3/uL — ABNORMAL HIGH (ref 3.6–11.0)

## 2015-06-22 LAB — GLUCOSE, CAPILLARY: GLUCOSE-CAPILLARY: 256 mg/dL — AB (ref 65–99)

## 2015-06-22 LAB — PROTIME-INR
INR: 2.95
PROTHROMBIN TIME: 30.2 s — AB (ref 11.4–15.0)

## 2015-06-22 LAB — APTT: aPTT: 45 seconds — ABNORMAL HIGH (ref 24–36)

## 2015-06-22 LAB — TROPONIN I: TROPONIN I: 0.2 ng/mL — AB (ref <0.031)

## 2015-06-22 LAB — LACTIC ACID, PLASMA
Lactic Acid, Venous: 1.6 mmol/L (ref 0.5–2.0)
Lactic Acid, Venous: 1.6 mmol/L (ref 0.5–2.0)

## 2015-06-22 MED ORDER — LISINOPRIL 10 MG PO TABS
10.0000 mg | ORAL_TABLET | Freq: Every day | ORAL | Status: DC
  Administered 2015-06-23 – 2015-06-26 (×4): 10 mg via ORAL
  Filled 2015-06-22 (×4): qty 1

## 2015-06-22 MED ORDER — FUROSEMIDE 20 MG PO TABS
20.0000 mg | ORAL_TABLET | Freq: Every day | ORAL | Status: DC
  Administered 2015-06-23 – 2015-06-26 (×4): 20 mg via ORAL
  Filled 2015-06-22 (×4): qty 1

## 2015-06-22 MED ORDER — DONEPEZIL HCL 5 MG PO TABS
5.0000 mg | ORAL_TABLET | Freq: Every day | ORAL | Status: DC
  Administered 2015-06-22 – 2015-06-25 (×4): 5 mg via ORAL
  Filled 2015-06-22 (×4): qty 1

## 2015-06-22 MED ORDER — HEPARIN SODIUM (PORCINE) 5000 UNIT/ML IJ SOLN: 5000.0000 [IU] | Freq: Three times a day (TID) | INTRAMUSCULAR | Status: DC

## 2015-06-22 MED ORDER — VANCOMYCIN HCL IN DEXTROSE 1-5 GM/200ML-% IV SOLN
1000.0000 mg | Freq: Once | INTRAVENOUS | Status: AC
  Administered 2015-06-22: 1000 mg via INTRAVENOUS
  Filled 2015-06-22: qty 200

## 2015-06-22 MED ORDER — CETYLPYRIDINIUM CHLORIDE 0.05 % MT LIQD
7.0000 mL | Freq: Two times a day (BID) | OROMUCOSAL | Status: DC
  Administered 2015-06-23 – 2015-06-26 (×4): 7 mL via OROMUCOSAL

## 2015-06-22 MED ORDER — INSULIN ASPART 100 UNIT/ML ~~LOC~~ SOLN
0.0000 [IU] | Freq: Three times a day (TID) | SUBCUTANEOUS | Status: DC
  Administered 2015-06-24: 3 [IU] via SUBCUTANEOUS
  Administered 2015-06-24: 2 [IU] via SUBCUTANEOUS
  Administered 2015-06-25: 1 [IU] via SUBCUTANEOUS
  Administered 2015-06-25: 2 [IU] via SUBCUTANEOUS
  Administered 2015-06-25 – 2015-06-26 (×2): 1 [IU] via SUBCUTANEOUS
  Filled 2015-06-22: qty 1
  Filled 2015-06-22: qty 2
  Filled 2015-06-22: qty 1
  Filled 2015-06-22: qty 3
  Filled 2015-06-22: qty 2
  Filled 2015-06-22 (×2): qty 1

## 2015-06-22 MED ORDER — SODIUM CHLORIDE 0.9 % IV BOLUS (SEPSIS)
1000.0000 mL | Freq: Once | INTRAVENOUS | Status: AC
  Administered 2015-06-22: 1000 mL via INTRAVENOUS

## 2015-06-22 MED ORDER — PIPERACILLIN-TAZOBACTAM 3.375 G IVPB
3.3750 g | Freq: Once | INTRAVENOUS | Status: AC
  Administered 2015-06-22: 3.375 g via INTRAVENOUS
  Filled 2015-06-22: qty 50

## 2015-06-22 MED ORDER — METFORMIN HCL ER 500 MG PO TB24
500.0000 mg | ORAL_TABLET | Freq: Every day | ORAL | Status: DC
  Administered 2015-06-23 – 2015-06-26 (×4): 500 mg via ORAL
  Filled 2015-06-22 (×4): qty 1

## 2015-06-22 MED ORDER — RIVAROXABAN 20 MG PO TABS: 20.0000 mg | ORAL_TABLET | Freq: Every day | ORAL | Status: DC

## 2015-06-22 MED ORDER — INSULIN GLARGINE 100 UNIT/ML ~~LOC~~ SOLN
42.0000 [IU] | Freq: Every day | SUBCUTANEOUS | Status: DC
  Administered 2015-06-22 – 2015-06-25 (×4): 42 [IU] via SUBCUTANEOUS
  Filled 2015-06-22 (×5): qty 0.42

## 2015-06-22 MED ORDER — SIMVASTATIN 40 MG PO TABS
40.0000 mg | ORAL_TABLET | Freq: Every day | ORAL | Status: DC
Start: 2015-06-23 — End: 2015-06-26
  Administered 2015-06-23 – 2015-06-26 (×4): 40 mg via ORAL
  Filled 2015-06-22 (×4): qty 1

## 2015-06-22 MED ORDER — SODIUM CHLORIDE 0.9% FLUSH
3.0000 mL | Freq: Two times a day (BID) | INTRAVENOUS | Status: DC
  Administered 2015-06-22 – 2015-06-26 (×8): 3 mL via INTRAVENOUS

## 2015-06-22 MED ORDER — OMEGA-3-ACID ETHYL ESTERS 1 G PO CAPS
1.0000 g | ORAL_CAPSULE | Freq: Every day | ORAL | Status: DC
  Administered 2015-06-23 – 2015-06-26 (×4): 1 g via ORAL
  Filled 2015-06-22 (×4): qty 1

## 2015-06-22 MED ORDER — PANTOPRAZOLE SODIUM 40 MG PO TBEC
40.0000 mg | DELAYED_RELEASE_TABLET | Freq: Every day | ORAL | Status: DC
  Administered 2015-06-23 – 2015-06-26 (×4): 40 mg via ORAL
  Filled 2015-06-22 (×4): qty 1

## 2015-06-22 MED ORDER — HYDROCODONE-ACETAMINOPHEN 5-325 MG PO TABS
0.5000 | ORAL_TABLET | Freq: Four times a day (QID) | ORAL | Status: DC | PRN
  Administered 2015-06-22 – 2015-06-23 (×4): 1 via ORAL
  Filled 2015-06-22 (×4): qty 1

## 2015-06-22 MED ORDER — LATANOPROST 0.005 % OP SOLN
1.0000 [drp] | Freq: Every day | OPHTHALMIC | Status: DC
  Administered 2015-06-24 – 2015-06-25 (×2): 1 [drp] via OPHTHALMIC
  Filled 2015-06-22: qty 2.5

## 2015-06-22 MED ORDER — DOCUSATE SODIUM 100 MG PO CAPS
100.0000 mg | ORAL_CAPSULE | Freq: Two times a day (BID) | ORAL | Status: DC
  Administered 2015-06-22 – 2015-06-26 (×8): 100 mg via ORAL
  Filled 2015-06-22 (×8): qty 1

## 2015-06-22 NOTE — ED Notes (Signed)
hospitalist in to see pt.

## 2015-06-22 NOTE — H&P (Signed)
[facility] [place] [facility]   PATIENT NAME: Deborah Snyder    MR#:  [id number]  DATE OF BIRTH:  1922/07/22  DATE OF ADMISSION:  06/22/2015  PRIMARY CARE PHYSICIAN: No PCP Per Patient   REQUESTING/REFERRING PHYSICIAN: Inocencio HomesGayle  CHIEF COMPLAINT:   Chief Complaint  Patient presents with  . Generalized Body Aches  . Fever    HISTORY OF PRESENT ILLNESS: Deborah Plumbdith Porte  is a 82 y.o. female with a known history of Dementia, diabetes, hypertension, hyperlipidemia, atrial fibrillation-lives in a nursing home, and as per the records from ER she was sent to the emergency room for feeling weak and having fever. Patient noted to have tachycardia in ER but her temperature was never higher than 100F. Initial workup for the infections were negative but had slight elevated white blood cell count. ER physician gave a dose of broad-spectrum antibiotic and given his admission for possible SIRS. Patient has dementia , she is alert but not very oriented and not able to give me any history.   PAST MEDICAL HISTORY:   Past Medical History  Diagnosis Date  . Dementia   . Edema   . Diabetes (HCC)   . Hypertension   . GERD (gastroesophageal reflux disease)   . Hyperlipidemia   . Atrial fibrillation (HCC)     PAST SURGICAL HISTORY: History reviewed. No pertinent past surgical history.  SOCIAL HISTORY:  Social History  Substance Use Topics  . Smoking status: Never Smoker   . Smokeless tobacco: Not on file  . Alcohol Use: No    FAMILY HISTORY:  Family History  Problem Relation Age of Onset  . Family history unknown: Yes    DRUG ALLERGIES:  Allergies  Allergen Reactions  . Sulfa Antibiotics     REVIEW OF SYSTEMS:   Because of dementia, she is not able to give a review of system.   MEDICATIONS AT HOME:  Prior to Admission medications   Medication Sig Start Date End Date Taking? Authorizing Provider  amoxicillin-clavulanate (AUGMENTIN) 875-125 MG per tablet Take 1  tablet by mouth every 12 (twelve) hours. 08/31/14   Lutricia FeilWilliam P Roemer, PA-C  docusate sodium (COLACE) 100 MG capsule Take 100 mg by mouth 2 (two) times daily.    Historical Provider, MD  donepezil (ARICEPT) 5 MG tablet Take 5 mg by mouth at bedtime.    Historical Provider, MD  doxycycline (VIBRAMYCIN) 100 MG capsule Take 1 capsule (100 mg total) by mouth 2 (two) times daily. 05/19/15   Tommie SamsJayce G Cook, DO  fluorouracil (EFUDEX) 5 % cream Apply topically 2 (two) times daily.    Historical Provider, MD  furosemide (LASIX) 20 MG tablet Take 20 mg by mouth.    Historical Provider, MD  HYDROcodone-acetaminophen (NORCO/VICODIN) 5-325 MG per tablet Take 0.5-1 tablets by mouth every 6 (six) hours as needed for moderate pain or severe pain. 08/31/14   Lutricia FeilWilliam P Roemer, PA-C  insulin glargine (LANTUS) 100 UNIT/ML injection Inject 42 Units into the skin at bedtime.    Historical Provider, MD  insulin lispro (HUMALOG) 100 UNIT/ML injection Inject into the skin 3 (three) times daily before meals.    Historical Provider, MD  latanoprost (XALATAN) 0.005 % ophthalmic solution 1 drop at bedtime.    Historical Provider, MD  lisinopril (PRINIVIL,ZESTRIL) 10 MG tablet Take 10 mg by mouth daily.    Historical Provider, MD  loperamide (IMODIUM) 2 MG capsule Take 2 mg by mouth as needed for diarrhea or loose stools.  Historical Provider, MD  metFORMIN (GLUMETZA) 500 MG (MOD) 24 hr tablet Take 500 mg by mouth daily with breakfast.    Historical Provider, MD  Omega-3 Fatty Acids (FISH OIL) 1200 MG CAPS Take by mouth.    Historical Provider, MD  omeprazole (PRILOSEC) 20 MG capsule Take 20 mg by mouth daily.    Historical Provider, MD  potassium citrate (UROCIT-K) 10 MEQ (1080 MG) SR tablet Take 10 mEq by mouth 3 (three) times daily with meals.    Historical Provider, MD  rivaroxaban (XARELTO) 20 MG TABS tablet Take 20 mg by mouth daily with supper.    Historical Provider, MD  simvastatin (ZOCOR) 40 MG tablet Take 40 mg by mouth  daily.    Historical Provider, MD      PHYSICAL EXAMINATION:   VITAL SIGNS: Blood pressure 132/77, pulse 117, temperature 99.6 F (37.6 C), temperature source Oral, resp. rate 29, height 5\' 7"  (1.702 m), weight 92.307 kg (203 lb 8 oz), SpO2 96 %.  GENERAL:  82 y.o.-year-old patient lying in the bed with no acute distress.  EYES: Pupils equal, round, reactive to light and accommodation. No scleral icterus. Extraocular muscles intact.  HEENT: Head atraumatic, normocephalic. Oropharynx and nasopharynx clear. No tenderness on the local pressure on paranasal and frontal sinuses.  NECK:  Supple, no jugular venous distention. No thyroid enlargement, no tenderness.  LUNGS: Normal breath sounds bilaterally, no wheezing, rales,rhonchi or crepitation. No use of accessory muscles of respiration.  CARDIOVASCULAR: S1, S2 normal. No murmurs, rubs, or gallops.  ABDOMEN: Soft, nontender, nondistended. Bowel sounds present. No organomegaly or mass.  EXTREMITIES: No pedal edema, cyanosis, or clubbing.  NEUROLOGIC: Cranial nerves II through XII are intact. Muscle strength 5/5 in all extremities. Sensation intact. Gait not checked.  PSYCHIATRIC: The patient is alert and oriented x 1.  SKIN: No obvious rash, lesion, or ulcer.   LABORATORY PANEL:   CBC  Recent Labs Lab 06/22/15 1848  WBC 12.8*  HGB 10.8*  HCT 32.6*  PLT 159  MCV 85.7  MCH 28.4  MCHC 33.2  RDW 14.9*  LYMPHSABS 0.3*  MONOABS 0.8  EOSABS 0.0  BASOSABS 0.0   ------------------------------------------------------------------------------------------------------------------  Chemistries   Recent Labs Lab 06/22/15 1848  NA 133*  K 4.1  CL 101  CO2 24  GLUCOSE 288*  BUN 26*  CREATININE 1.11*  CALCIUM 8.4*  AST 37  ALT 31  ALKPHOS 64  BILITOT 1.8*   ------------------------------------------------------------------------------------------------------------------ estimated creatinine clearance is 39.3 mL/min (by C-G  formula based on Cr of 1.11). ------------------------------------------------------------------------------------------------------------------ No results for input(s): TSH, T4TOTAL, T3FREE, THYROIDAB in the last 72 hours.  Invalid input(s): FREET3   Coagulation profile  Recent Labs Lab 06/22/15 1848  INR 2.95   ------------------------------------------------------------------------------------------------------------------- No results for input(s): DDIMER in the last 72 hours. -------------------------------------------------------------------------------------------------------------------  Cardiac Enzymes  Recent Labs Lab 06/22/15 1848  TROPONINI 0.20*   ------------------------------------------------------------------------------------------------------------------ Invalid input(s): POCBNP  ---------------------------------------------------------------------------------------------------------------  Urinalysis    Component Value Date/Time   COLORURINE AMBER* 06/22/2015 1950   COLORURINE Yellow 03/23/2013 0025   APPEARANCEUR HAZY* 06/22/2015 1950   APPEARANCEUR Clear 03/23/2013 0025   LABSPEC 1.025 06/22/2015 1950   LABSPEC 1.015 03/23/2013 0025   PHURINE 5.0 06/22/2015 1950   PHURINE 6.0 03/23/2013 0025   GLUCOSEU >500* 06/22/2015 1950   GLUCOSEU Negative 03/23/2013 0025   HGBUR 2+* 06/22/2015 1950   HGBUR Negative 03/23/2013 0025   BILIRUBINUR NEGATIVE 06/22/2015 1950   BILIRUBINUR Negative 03/23/2013 0025   KETONESUR TRACE* 06/22/2015 1950  KETONESUR Negative 03/23/2013 0025   PROTEINUR 100* 06/22/2015 1950   PROTEINUR Negative 03/23/2013 0025   NITRITE POSITIVE* 06/22/2015 1950   NITRITE Positive 03/23/2013 0025   LEUKOCYTESUR NEGATIVE 06/22/2015 1950   LEUKOCYTESUR Trace 03/23/2013 0025     RADIOLOGY: Dg Chest 2 View  06/22/2015  CLINICAL DATA:  Chest pain EXAM: CHEST  2 VIEW COMPARISON:  03/22/2013 FINDINGS: Moderate cardiac enlargement.  Vascular pattern normal. No evidence of edema or consolidation. IMPRESSION: Stable cardiac enlargement.  No acute findings. Electronically Signed   By: Esperanza Heir M.D.   On: 06/22/2015 19:50    EKG: Orders placed or performed during the hospital encounter of 06/22/15  . ED EKG  . ED EKG  . EKG 12-Lead  . EKG 12-Lead  . EKG 12-Lead  . EKG 12-Lead    IMPRESSION AND PLAN:  * Tachycardia   Suspected SIRS by ER physician, and no evidence for any infection.   ER physician gave broad-spectrum antibiotic 1 dose. Sent blood cultures.   I will hold on any antibiotics at this time, until patient started having fever over we have clear source of infection.   As patient is not in any discomfort, I think tachycardia his chest because of her atrial fibrillation.   Monitor on telemetry.  * Atrial fibrillation   Continue rate controlling medication and xarelto.  * Diabetes   Continue long-acting insulin and keep on sliding scale coverage.  * Hypertension   Continue home medication.  * Dementia   This is at baseline, continue home medication.  All the records are reviewed and case discussed with ED provider. Management plans discussed with the patient, family and they are in agreement.  CODE STATUS: Full code.  Code Status History    This patient does not have a recorded code status. Please follow your organizational policy for patients in this situation.    Advance Directive Documentation        Most Recent Value   Type of Advance Directive  Living will   Pre-existing out of facility DNR order (yellow form or pink MOST form)     "MOST" Form in Place?         TOTAL TIME TAKING CARE OF THIS PATIENT:  50 minutes.    Altamese Dilling M.D on 06/22/2015   Between 7am to 6pm - Pager - [phone]  After 6pm go to [email or web address] - Social research officer, government  [facility]  Office  [phone]  CC: Primary care physician; No PCP Per Patient   Note: This  dictation was prepared with Dragon dictation along with smaller phrase technology. Any transcriptional errors that result from this process are unintentional.

## 2015-06-22 NOTE — ED Provider Notes (Addendum)
[facility] Emergency Department Provider Note   ____________________________________________  Time seen: Approximately 6:39 PM  I have reviewed the triage vital signs and the nursing notes.   HISTORY  Chief Complaint Generalized Body Aches and Fever  Caveat-history of present illness and review of systems Limited due to the patient's dementia. Information obtained from her family at bedside as well as EMS on their arrival.  HPI Deborah Snyder is a 82 y.o. female with dementia, hypertension, hyperlipidemia, GERD, diabetes, atrial fibrillation on Xarelto who presents from her living facility due to cough, fever, chest pain today. The patient denies any pain complaints. She is unsure of why she is actually here. Per EMS, she was mildly tachycardic but the remainder of her vital signs were stable. According to her family at bedside, she was treated last month for a groin abscess however that seemed to resolve with antibiotics.   Past Medical History  Diagnosis Date  . Dementia   . Edema   . Diabetes (HCC)   . Hypertension   . GERD (gastroesophageal reflux disease)   . Hyperlipidemia     There are no active problems to display for this patient.   History reviewed. No pertinent past surgical history.  Current Outpatient Rx  Name  Route  Sig  Dispense  Refill  . amoxicillin-clavulanate (AUGMENTIN) 875-125 MG per tablet   Oral   Take 1 tablet by mouth every 12 (twelve) hours.   20 tablet   0   . docusate sodium (COLACE) 100 MG capsule   Oral   Take 100 mg by mouth 2 (two) times daily.         Marland Kitchen. donepezil (ARICEPT) 5 MG tablet   Oral   Take 5 mg by mouth at bedtime.         Marland Kitchen. doxycycline (VIBRAMYCIN) 100 MG capsule   Oral   Take 1 capsule (100 mg total) by mouth 2 (two) times daily.   20 capsule   0   . fluorouracil (EFUDEX) 5 % cream   Topical   Apply topically 2 (two) times daily.         . furosemide (LASIX) 20 MG tablet    Oral   Take 20 mg by mouth.         Marland Kitchen. HYDROcodone-acetaminophen (NORCO/VICODIN) 5-325 MG per tablet   Oral   Take 0.5-1 tablets by mouth every 6 (six) hours as needed for moderate pain or severe pain.   10 tablet   0   . insulin glargine (LANTUS) 100 UNIT/ML injection   Subcutaneous   Inject 42 Units into the skin at bedtime.         . insulin lispro (HUMALOG) 100 UNIT/ML injection   Subcutaneous   Inject into the skin 3 (three) times daily before meals.         . latanoprost (XALATAN) 0.005 % ophthalmic solution      1 drop at bedtime.         Marland Kitchen. lisinopril (PRINIVIL,ZESTRIL) 10 MG tablet   Oral   Take 10 mg by mouth daily.         Marland Kitchen. loperamide (IMODIUM) 2 MG capsule   Oral   Take 2 mg by mouth as needed for diarrhea or loose stools.         . metFORMIN (GLUMETZA) 500 MG (MOD) 24 hr tablet   Oral   Take 500 mg by mouth daily with breakfast.         .  Omega-3 Fatty Acids (FISH OIL) 1200 MG CAPS   Oral   Take by mouth.         Marland Kitchen omeprazole (PRILOSEC) 20 MG capsule   Oral   Take 20 mg by mouth daily.         . potassium citrate (UROCIT-K) 10 MEQ (1080 MG) SR tablet   Oral   Take 10 mEq by mouth 3 (three) times daily with meals.         . rivaroxaban (XARELTO) 20 MG TABS tablet   Oral   Take 20 mg by mouth daily with supper.         . simvastatin (ZOCOR) 40 MG tablet   Oral   Take 40 mg by mouth daily.           Allergies Sulfa antibiotics  History reviewed. No pertinent family history.  Social History Social History  Substance Use Topics  . Smoking status: Never Smoker   . Smokeless tobacco: None  . Alcohol Use: No    Review of Systems Constitutional: + fever, no chills    Caveat-history of present illness and review of systems Limited due to the patient's dementia. Information obtained from her family at bedside as well as EMS on their arrival. ____________________________________________   PHYSICAL EXAM:  Filed  Vitals:   06/22/15 1930 06/22/15 1945 06/22/15 2000 06/22/15 2027  BP:   141/91   Pulse: 105 94    Temp:    99.6 F (37.6 C)  TempSrc:    Oral  Resp: 21 25 24    Height:      Weight:      SpO2: 99% 97%       Constitutional: Alert and oriented to self only. Nontoxic- appearing and in no acute distress. Eyes: Conjunctivae are normal. PERRL. EOMI. Head: Atraumatic. Nose: No congestion/rhinnorhea. Mouth/Throat: Mucous membranes are moist.  Oropharynx non-erythematous. Neck: No stridor. Supple without meningismus. Cardiovascular: Tachycardic rate, irregular rhythm. Grossly normal heart sounds.  Good peripheral circulation. Respiratory: Normal respiratory effort.  No retractions. Lungs CTAB. Mild tachypnea. Gastrointestinal: Soft and nontender. No distention.  No CVA tenderness. Genitourinary: No groin abscess appreciated, normal external genitalia. Musculoskeletal: No lower extremity tenderness nor edema.  No joint effusions. Neurologic:  Normal speech and language. No gross focal neurologic deficits are appreciated.  Skin:  Skin is warm, dry and intact. No rash noted. Psychiatric: Mood and affect are normal. Speech and behavior are normal.  ____________________________________________   LABS (all labs ordered are listed, but only abnormal results are displayed)  Labs Reviewed  CBC WITH DIFFERENTIAL/PLATELET - Abnormal; Notable for the following:    WBC 12.8 (*)    Hemoglobin 10.8 (*)    HCT 32.6 (*)    RDW 14.9 (*)    Neutro Abs 11.7 (*)    Lymphs Abs 0.3 (*)    All other components within normal limits  COMPREHENSIVE METABOLIC PANEL - Abnormal; Notable for the following:    Sodium 133 (*)    Glucose, Bld 288 (*)    BUN 26 (*)    Creatinine, Ser 1.11 (*)    Calcium 8.4 (*)    Total Bilirubin 1.8 (*)    GFR calc non Af Amer 42 (*)    GFR calc Af Amer 49 (*)    All other components within normal limits  TROPONIN I - Abnormal; Notable for the following:    Troponin I  0.20 (*)    All other components within normal limits  URINALYSIS COMPLETEWITH  MICROSCOPIC ([facility] ONLY) - Abnormal; Notable for the following:    Color, Urine AMBER (*)    APPearance HAZY (*)    Glucose, UA >500 (*)    Ketones, ur TRACE (*)    Hgb urine dipstick 2+ (*)    Protein, ur 100 (*)    Nitrite POSITIVE (*)    Bacteria, UA MANY (*)    Squamous Epithelial / LPF 0-5 (*)    All other components within normal limits  PROTIME-INR - Abnormal; Notable for the following:    Prothrombin Time 30.2 (*)    All other components within normal limits  APTT - Abnormal; Notable for the following:    aPTT 45 (*)    All other components within normal limits  CULTURE, BLOOD (ROUTINE X 2)  CULTURE, BLOOD (ROUTINE X 2)  LACTIC ACID, PLASMA  LACTIC ACID, PLASMA   ____________________________________________  EKG  ED ECG REPORT I, Gayla Doss, the attending physician, personally viewed and interpreted this ECG.   Date: 06/22/2015  EKG Time: 18:51  Rate: 113  Rhythm: atrial fibrillation, rate 113  Axis: normal  Intervals:Right bundle branch block and left anterior fascicular block.  ST&T Change: No acute ST elevation. Multiple PVCs in pairs.  ED ECG REPORT I, Gayla Doss, the attending physician, personally viewed and interpreted this ECG.   Date: 06/22/2015  EKG Time: 20:25  Rate: 112  Rhythm: atrial fibrillation, rate 112  Axis: normal  Intervals:left anterior fascicular block, RBBB  ST&T Change: No acute ST elevation. Multiple PVCs. LVH.    ____________________________________________  RADIOLOGY  CXR IMPRESSION: Stable cardiac enlargement. No acute findings. ____________________________________________   PROCEDURES  Procedure(s) performed: None  Critical Care performed: Yes, see critical care note(s). Total critical care time spent 35 minutes.  ____________________________________________   INITIAL IMPRESSION / ASSESSMENT AND PLAN / ED  COURSE  Pertinent labs & imaging results that were available during my care of the patient were reviewed by me and considered in my medical decision making (see chart for details).  Deborah Snyder is a 82 y.o. female with dementia, hypertension, hyperlipidemia, GERD, diabetes, atrial fibrillation on Xarelto who presents from her living facility due to cough, fever. On exam, she is in no acute distress. Vital signs are markable for tachycardia and tachypnea, good sepsis initiated, we'll give IV fluids, vancomycin, Zosyn and obtain screening labs, cultures, lactic acid, chest x-ray urinalysis and anticipate admission.  ----------------------------------------- 8:36 PM on 06/22/2015 ----------------------------------------- Labs notable for leukocytosis, mild anemia. CMP shows mild creatinine elevation. Troponin is elevated 0.2, likely demand ischemia. Urinalysis is concerning for nitrite positive urinary tract infection which is the most likely cause of the patient's sepsis. He has lactic acid is 1.6, she is not hypotensive, no evidence to suggest septic shock at this time so given her questionable history of heart failure, will not give full 30 L/kg bolus of normal saline due to risk of precipitatin flash pulmonary edema. Case discussed with the hospitalist, Dr. Charlann Lange, for admission at this time.  ____________________________________________   FINAL CLINICAL IMPRESSION(S) / ED DIAGNOSES  Final diagnoses:  Sepsis secondary to UTI [facility])  NSTEMI (non-ST elevated myocardial infarction) (HCC)      NEW MEDICATIONS STARTED DURING THIS VISIT:  New Prescriptions   No medications on file     Note:  This document was prepared using Dragon voice recognition software and may include unintentional dictation errors.    Gayla Doss, MD 06/22/15 2037  Gayla Doss, MD 06/22/15 [phone]

## 2015-06-22 NOTE — ED Notes (Signed)
Pt placed on oxygen at 2lpm via nasal cannula for PVCs. Introduced self to pt and family. Pt provided with blankets. Call bell placed to right side. Pt states she has pain but is unable to quantify or describe location. Pt placed on cardiac monitor. Skin warm and dry, slightly pale. Pt's daughter thinks pt may have a sinus infection and "would like you to check her sinuses."

## 2015-06-22 NOTE — ED Notes (Signed)
Breath sounds unchanged after first liter of ns bolus. Pt with tachypnea noted as on initial assessment after this rn assumed care.

## 2015-06-22 NOTE — ED Notes (Signed)
Report given to April B, RN.  

## 2015-06-22 NOTE — ED Notes (Signed)
Pt taken to Xray.

## 2015-06-22 NOTE — Plan of Care (Signed)
Problem: Safety: Goal: Ability to remain free from injury will improve Outcome: Progressing High Fall Risk precautions in place.

## 2015-06-22 NOTE — ED Notes (Signed)
[facility] Per EMS they were called out for chest pain, EMS states pt c/o generalized body aches and fever, pt presents with no-productive cough to the ED. Pt has hx of dementia, and was A-fib on the monitor with a rate of 112.

## 2015-06-22 NOTE — ED Notes (Signed)
Critical troponin of 0.20 called from lab. Dr. Inocencio HomesGayle notified.

## 2015-06-22 NOTE — Care Management Obs Status (Signed)
MEDICARE OBSERVATION STATUS NOTIFICATION   Patient Details  Name: Marilynn Raildith M Guadarrama MRN: [id number] Date of Birth: 05-Aug-1922   Medicare Observation Status Notification Given:  Yes    CrutchfieldDerrill Memo, Verba Ainley M, RN 06/22/2015, 10:33 PM

## 2015-06-23 DIAGNOSIS — F0280 Dementia in other diseases classified elsewhere without behavioral disturbance: Secondary | ICD-10-CM | POA: Diagnosis present

## 2015-06-23 DIAGNOSIS — R6510 Systemic inflammatory response syndrome (SIRS) of non-infectious origin without acute organ dysfunction: Secondary | ICD-10-CM | POA: Diagnosis present

## 2015-06-23 DIAGNOSIS — R509 Fever, unspecified: Secondary | ICD-10-CM | POA: Diagnosis present

## 2015-06-23 DIAGNOSIS — Z794 Long term (current) use of insulin: Secondary | ICD-10-CM | POA: Diagnosis not present

## 2015-06-23 DIAGNOSIS — Z7984 Long term (current) use of oral hypoglycemic drugs: Secondary | ICD-10-CM | POA: Diagnosis not present

## 2015-06-23 DIAGNOSIS — Z79899 Other long term (current) drug therapy: Secondary | ICD-10-CM | POA: Diagnosis not present

## 2015-06-23 DIAGNOSIS — K219 Gastro-esophageal reflux disease without esophagitis: Secondary | ICD-10-CM | POA: Diagnosis present

## 2015-06-23 DIAGNOSIS — A419 Sepsis, unspecified organism: Secondary | ICD-10-CM | POA: Diagnosis present

## 2015-06-23 DIAGNOSIS — G309 Alzheimer's disease, unspecified: Secondary | ICD-10-CM | POA: Diagnosis present

## 2015-06-23 DIAGNOSIS — J45909 Unspecified asthma, uncomplicated: Secondary | ICD-10-CM | POA: Diagnosis present

## 2015-06-23 DIAGNOSIS — E785 Hyperlipidemia, unspecified: Secondary | ICD-10-CM | POA: Diagnosis present

## 2015-06-23 DIAGNOSIS — B961 Klebsiella pneumoniae [K. pneumoniae] as the cause of diseases classified elsewhere: Secondary | ICD-10-CM | POA: Diagnosis present

## 2015-06-23 DIAGNOSIS — I10 Essential (primary) hypertension: Secondary | ICD-10-CM | POA: Diagnosis present

## 2015-06-23 DIAGNOSIS — I482 Chronic atrial fibrillation: Secondary | ICD-10-CM | POA: Diagnosis present

## 2015-06-23 DIAGNOSIS — E119 Type 2 diabetes mellitus without complications: Secondary | ICD-10-CM | POA: Diagnosis present

## 2015-06-23 DIAGNOSIS — N390 Urinary tract infection, site not specified: Secondary | ICD-10-CM | POA: Diagnosis present

## 2015-06-23 LAB — GLUCOSE, CAPILLARY
GLUCOSE-CAPILLARY: 99 mg/dL (ref 65–99)
Glucose-Capillary: 85 mg/dL (ref 65–99)
Glucose-Capillary: 86 mg/dL (ref 65–99)

## 2015-06-23 LAB — CBC
HCT: 27.5 % — ABNORMAL LOW (ref 35.0–47.0)
HEMOGLOBIN: 9.5 g/dL — AB (ref 12.0–16.0)
MCH: 29.6 pg (ref 26.0–34.0)
MCHC: 34.4 g/dL (ref 32.0–36.0)
MCV: 86 fL (ref 80.0–100.0)
PLATELETS: 136 10*3/uL — AB (ref 150–440)
RBC: 3.2 MIL/uL — AB (ref 3.80–5.20)
RDW: 15 % — ABNORMAL HIGH (ref 11.5–14.5)
WBC: 9.6 10*3/uL (ref 3.6–11.0)

## 2015-06-23 LAB — BASIC METABOLIC PANEL
ANION GAP: 7 (ref 5–15)
ANION GAP: 5 (ref 5–15)
BUN: 23 mg/dL — ABNORMAL HIGH (ref 6–20)
BUN: 22 mg/dL — ABNORMAL HIGH (ref 6–20)
CALCIUM: 7.9 mg/dL — AB (ref 8.9–10.3)
CALCIUM: 7.8 mg/dL — AB (ref 8.9–10.3)
CO2: 24 mmol/L (ref 22–32)
CO2: 23 mmol/L (ref 22–32)
CREATININE: 0.84 mg/dL (ref 0.44–1.00)
CREATININE: 1.01 mg/dL — AB (ref 0.44–1.00)
Chloride: 107 mmol/L (ref 101–111)
Chloride: 106 mmol/L (ref 101–111)
GFR, EST AFRICAN AMERICAN: 55 mL/min — AB (ref >60)
GFR, EST NON AFRICAN AMERICAN: 59 mL/min — AB (ref >60)
GFR, EST NON AFRICAN AMERICAN: 48 mL/min — AB (ref >60)
Glucose, Bld: 128 mg/dL — ABNORMAL HIGH (ref 65–99)
Glucose, Bld: 82 mg/dL (ref 65–99)
Potassium: 3.8 mmol/L (ref 3.5–5.1)
Potassium: 3.8 mmol/L (ref 3.5–5.1)
SODIUM: 138 mmol/L (ref 135–145)
SODIUM: 134 mmol/L — AB (ref 135–145)

## 2015-06-23 LAB — LACTIC ACID, PLASMA
LACTIC ACID, VENOUS: 0.9 mmol/L (ref 0.5–2.0)
LACTIC ACID, VENOUS: 0.9 mmol/L (ref 0.5–2.0)

## 2015-06-23 LAB — MAGNESIUM: MAGNESIUM: 1.6 mg/dL — AB (ref 1.7–2.4)

## 2015-06-23 LAB — MRSA PCR SCREENING: MRSA by PCR: NEGATIVE

## 2015-06-23 MED ORDER — PIPERACILLIN-TAZOBACTAM 3.375 G IVPB 30 MIN: 3.3750 g | Freq: Three times a day (TID) | INTRAVENOUS | Status: DC

## 2015-06-23 MED ORDER — PIPERACILLIN-TAZOBACTAM 3.375 G IVPB
3.3750 g | Freq: Three times a day (TID) | INTRAVENOUS | Status: DC
  Administered 2015-06-23 – 2015-06-25 (×6): 3.375 g via INTRAVENOUS
  Filled 2015-06-23 (×9): qty 50

## 2015-06-23 MED ORDER — RIVAROXABAN 20 MG PO TABS
20.0000 mg | ORAL_TABLET | Freq: Every day | ORAL | Status: DC
  Administered 2015-06-23 – 2015-06-25 (×3): 20 mg via ORAL
  Filled 2015-06-23 (×3): qty 1

## 2015-06-23 MED ORDER — VANCOMYCIN HCL IN DEXTROSE 1-5 GM/200ML-% IV SOLN
1000.0000 mg | Freq: Once | INTRAVENOUS | Status: AC
  Administered 2015-06-23: 1000 mg via INTRAVENOUS
  Filled 2015-06-23: qty 200

## 2015-06-23 MED ORDER — MAGNESIUM SULFATE 2 GM/50ML IV SOLN
2.0000 g | Freq: Once | INTRAVENOUS | Status: AC
  Administered 2015-06-23: 2 g via INTRAVENOUS
  Filled 2015-06-23: qty 50

## 2015-06-23 MED ORDER — RIVAROXABAN 15 MG PO TABS: 15.0000 mg | ORAL_TABLET | Freq: Every day | ORAL | Status: DC

## 2015-06-23 MED ORDER — VANCOMYCIN HCL IN DEXTROSE 750-5 MG/150ML-% IV SOLN
750.0000 mg | Freq: Two times a day (BID) | INTRAVENOUS | Status: DC
  Administered 2015-06-23 – 2015-06-24 (×2): 750 mg via INTRAVENOUS
  Filled 2015-06-23 (×3): qty 150

## 2015-06-23 MED ORDER — IPRATROPIUM-ALBUTEROL 0.5-2.5 (3) MG/3ML IN SOLN: 3.0000 mL | RESPIRATORY_TRACT | Status: DC | PRN

## 2015-06-23 NOTE — Progress Notes (Signed)
[facility] requesting an [facility] on the patient. All questions were answered and no further concerns at this time. Patient is resting comfortably in bed. Nursing staff will continue to monitor. Lamonte RicherKara A Yosmar Ryker, RN

## 2015-06-23 NOTE — Progress Notes (Signed)
ANTIBIOTIC CONSULT NOTE - INITIAL  Pharmacy Consult for Vancomycin Indication: sepsis  Allergies  Allergen Reactions  . Sulfa Antibiotics     Patient Measurements: Height:  (170.2 cm) Weight: 201 lb 3.2 oz (91.264 kg) IBW/kg (Calculated) : 61.6 Adjusted Body Weight: 73.5 kg   Vital Signs: Temp: 98.2 F (36.8 C) (06/11 0530) Temp Source: Oral (06/11 0530) BP: 130/68 mmHg (06/11 0530) Pulse Rate: 84 (06/11 0530) Intake/Output from previous day:   Intake/Output from this shift:    Labs:  Recent Labs  06/22/15 1848 06/23/15 0539  WBC 12.8* 9.6  HGB 10.8* 9.5*  PLT 159 136*  CREATININE 1.11* 0.84   Estimated Creatinine Clearance: 51.6 mL/min (by C-G formula based on Cr of 0.84). No results for input(s): VANCOTROUGH, VANCOPEAK, VANCORANDOM, GENTTROUGH, GENTPEAK, GENTRANDOM, TOBRATROUGH, TOBRAPEAK, TOBRARND, AMIKACINPEAK, AMIKACINTROU, AMIKACIN in the last 72 hours.   Microbiology: Recent Results (from the past 720 hour(s))  MRSA PCR Screening     Status: None   Collection Time: 06/22/15 11:32 PM  Result Value Ref Range Status   MRSA by PCR NEGATIVE NEGATIVE Final    Comment:        The GeneXpert MRSA Assay (FDA approved for NASAL specimens only), is one component of a comprehensive MRSA colonization surveillance program. It is not intended to diagnose MRSA infection nor to guide or monitor treatment for MRSA infections.     Medical History: Past Medical History  Diagnosis Date  . Dementia   . Edema   . Diabetes (HCC)   . Hypertension   . GERD (gastroesophageal reflux disease)   . Hyperlipidemia   . Atrial fibrillation (HCC)     Medications:  Prescriptions prior to admission  Medication Sig Dispense Refill Last Dose  . amoxicillin-clavulanate (AUGMENTIN) 875-125 MG per tablet Take 1 tablet by mouth every 12 (twelve) hours. 20 tablet 0   . docusate sodium (COLACE) 100 MG capsule Take 100 mg by mouth 2 (two) times daily.   08/31/2014 at Unknown  time  . donepezil (ARICEPT) 5 MG tablet Take 5 mg by mouth at bedtime.     Marland Kitchen doxycycline (VIBRAMYCIN) 100 MG capsule Take 1 capsule (100 mg total) by mouth 2 (two) times daily. 20 capsule 0   . fluorouracil (EFUDEX) 5 % cream Apply topically 2 (two) times daily.     . furosemide (LASIX) 20 MG tablet Take 20 mg by mouth.     Marland Kitchen HYDROcodone-acetaminophen (NORCO/VICODIN) 5-325 MG per tablet Take 0.5-1 tablets by mouth every 6 (six) hours as needed for moderate pain or severe pain. 10 tablet 0   . insulin glargine (LANTUS) 100 UNIT/ML injection Inject 42 Units into the skin at bedtime.     . insulin lispro (HUMALOG) 100 UNIT/ML injection Inject into the skin 3 (three) times daily before meals.     . latanoprost (XALATAN) 0.005 % ophthalmic solution 1 drop at bedtime.     Marland Kitchen lisinopril (PRINIVIL,ZESTRIL) 10 MG tablet Take 10 mg by mouth daily.     Marland Kitchen loperamide (IMODIUM) 2 MG capsule Take 2 mg by mouth as needed for diarrhea or loose stools.     . metFORMIN (GLUMETZA) 500 MG (MOD) 24 hr tablet Take 500 mg by mouth daily with breakfast.     . Omega-3 Fatty Acids (FISH OIL) 1200 MG CAPS Take by mouth.     Marland Kitchen omeprazole (PRILOSEC) 20 MG capsule Take 20 mg by mouth daily.     . potassium citrate (UROCIT-K) 10 MEQ (1080 MG) SR  tablet Take 10 mEq by mouth 3 (three) times daily with meals.     . rivaroxaban (XARELTO) 20 MG TABS tablet Take 20 mg by mouth daily with supper.     . simvastatin (ZOCOR) 40 MG tablet Take 40 mg by mouth daily.      Assessment: Pharmacy consulted to dose vancomycin in this 82 year old female admitted with Sepsis. Ke = 0.047 hr-1 T1/2 = 14.74 hrs Vd = 51.45 L   Goal of Therapy:  Vancomycin trough level 15-20 mcg/ml  Plan:  Expected duration 7 days with resolution of temperature and/or normalization of WBC  Vancomycin 1 gm IV X 1 to be given on 6/11 @ 10:00. Vancomycin 750 mg IV Q12H to start 6/11 @ 16:00, ~ 6 hrs after 1st dose (stacked dosing). This pt will reach Css by 6/14  @ 10:00. Will draw 1st trough on 6/14 @ 3:30, which will be approaching Css.   Ryota Treece D 06/23/2015,9:48 AM

## 2015-06-23 NOTE — Progress Notes (Signed)
Pts magnesium is 1.6 and patient has audible wheeze at times. MD notified. Orders for IV magnesium replacement and SVNs every 4 hours as needed. I will continue to assess.

## 2015-06-23 NOTE — Progress Notes (Signed)
Pt has 2 separate small runs of VT. 4 beat run and 6 beat run per central telemetry. MD notified. Orders for EKG, serum magnesium and met b panel. I will continue to assess.

## 2015-06-23 NOTE — Progress Notes (Addendum)
[facility] [place] [facility]   PATIENT NAME: Deborah Snyder    MR#:  [id number]  DATE OF BIRTH:  1922-10-28  SUBJECTIVE:  CHIEF COMPLAINT:  Patient is lethargic and has baseline dementia. Arousable but unable to get any meaningful history  REVIEW OF SYSTEMS:  Review of systems unobtainable as patient is altered and has baseline dementia  DRUG ALLERGIES:   Allergies  Allergen Reactions  . Sulfa Antibiotics     VITALS:  Blood pressure 126/42, pulse 76, temperature 98.1 F (36.7 C), temperature source Oral, resp. rate 18, height  (1.702 m), weight 91.264 kg (201 lb 3.2 oz), SpO2 99 %.  PHYSICAL EXAMINATION:  GENERAL:  82 y.o.-year-old patient lying in the bed with no acute distress.  EYES: Pupils equal, round, reactive to light and accommodation. No scleral icterus.  HEENT: Head atraumatic, normocephalic. Oropharynx and nasopharynx clear.  NECK:  Supple, no jugular venous distention. No thyroid enlargement, no tenderness.  LUNGS: Normal breath sounds bilaterally, no wheezing, rales,rhonchi or crepitation. No use of accessory muscles of respiration.  CARDIOVASCULAR: S1, S2 normal. No murmurs, rubs, or gallops.  ABDOMEN: Soft, nontender, nondistended. Bowel sounds present. No organomegaly or mass.  EXTREMITIES: No pedal edema, cyanosis, or clubbing.  NEUROLOGIC: Patient is arousable. Lethargic and has chronic dementia PSYCH-patient is lethargic but arousable and has chronic dementia   CBC  Recent Labs Lab 06/23/15 0539  WBC 9.6  HGB 9.5*  HCT 27.5*  PLT 136*   ------------------------------------------------------------------------------------------------------------------  Chemistries   Recent Labs Lab 06/22/15 1848 06/23/15 0539  NA 133* 138  K 4.1 3.8  CL 101 107  CO2 24 24  GLUCOSE 288* 128*  BUN 26* 23*  CREATININE 1.11* 0.84  CALCIUM 8.4* 7.9*  AST 37  --   ALT 31  --   ALKPHOS 64  --   BILITOT 1.8*  --     ------------------------------------------------------------------------------------------------------------------  Cardiac Enzymes  Recent Labs Lab 06/22/15 1848  TROPONINI 0.20*   ------------------------------------------------------------------------------------------------------------------  RADIOLOGY:  Dg Chest 2 View  06/22/2015  CLINICAL DATA:  Chest pain EXAM: CHEST  2 VIEW COMPARISON:  03/22/2013 FINDINGS: Moderate cardiac enlargement. Vascular pattern normal. No evidence of edema or consolidation. IMPRESSION: Stable cardiac enlargement.  No acute findings. Electronically Signed   By: Esperanza Heir M.D.   On: 06/22/2015 19:50    EKG:   Orders placed or performed during the hospital encounter of 06/22/15  . ED EKG  . ED EKG  . EKG 12-Lead  . EKG 12-Lead  . EKG 12-Lead  . EKG 12-Lead    ASSESSMENT AND PLAN:   * Sepsis-meets criteria with tachycardia, fever Source is unclear at this time. Blood cultures 2 and urine culture and sensitivity are ordered. Started antibiotics Zosyn and vancomycin Chest x-rays negative Monitor lactate ER physician gave broad-spectrum antibiotic 1 dose.   * Atrial fibrillation  Continue rate controlling medication and xarelto.  * Diabetes  Continue long-acting insulin and keep on sliding scale coverage.  * Hypertension  Continue home medication.  * Dementia  This is at baseline, continue home medication.     All the records are reviewed and case discussed with Care Management/Social Workerr. CALL placed to son, no vm setup  CODE STATUS: FC  TOTAL TIME TAKING CARE OF THIS PATIENT: 35 minutes.   POSSIBLE D/C IN 2 DAYS, DEPENDING ON CLINICAL CONDITION.  Note: This dictation was prepared with Dragon dictation along with smaller phrase technology. Any transcriptional errors that result from  this process are unintentional.   Ramonita LabGouru, Rosaland Shiffman M.D on 06/23/2015 at 12:51 PM  Between 7am to 6pm - Pager -  [phone] After 6pm go to [email or web address] - password [email or web address] [facility]  [facility]  Office  [phone]  CC: Primary care physician; No PCP Per Patient

## 2015-06-23 NOTE — Progress Notes (Signed)
Patient arrived to 2A Room 245. Patient denies pain and all questions answered. Patient oriented to unit and Fall Safety Plan not signed as patient has advanced dementia; bed alarm on and fall precautions in place. Skin assessment completed with Richardo PriestMarcel RN and skin intact. A&O to person only, VSS, and A-fib on verified tele box #[id number]. Nursing staff will continue to monitor. Lamonte RicherKara A Reinette Cuneo, RN

## 2015-06-24 LAB — CBC
HCT: 27.9 % — ABNORMAL LOW (ref 35.0–47.0)
Hemoglobin: 9.5 g/dL — ABNORMAL LOW (ref 12.0–16.0)
MCH: 29.4 pg (ref 26.0–34.0)
MCHC: 33.9 g/dL (ref 32.0–36.0)
MCV: 86.7 fL (ref 80.0–100.0)
PLATELETS: 147 10*3/uL — AB (ref 150–440)
RBC: 3.22 MIL/uL — ABNORMAL LOW (ref 3.80–5.20)
RDW: 15.4 % — AB (ref 11.5–14.5)
WBC: 9.6 10*3/uL (ref 3.6–11.0)

## 2015-06-24 LAB — BASIC METABOLIC PANEL
ANION GAP: 7 (ref 5–15)
BUN: 24 mg/dL — AB (ref 6–20)
CALCIUM: 8 mg/dL — AB (ref 8.9–10.3)
CO2: 23 mmol/L (ref 22–32)
Chloride: 106 mmol/L (ref 101–111)
Creatinine, Ser: 1.03 mg/dL — ABNORMAL HIGH (ref 0.44–1.00)
GFR calc Af Amer: 54 mL/min — ABNORMAL LOW (ref >60)
GFR, EST NON AFRICAN AMERICAN: 46 mL/min — AB (ref >60)
GLUCOSE: 159 mg/dL — AB (ref 65–99)
Potassium: 3.9 mmol/L (ref 3.5–5.1)
Sodium: 136 mmol/L (ref 135–145)

## 2015-06-24 LAB — GLUCOSE, CAPILLARY
GLUCOSE-CAPILLARY: 182 mg/dL — AB (ref 65–99)
Glucose-Capillary: 119 mg/dL — ABNORMAL HIGH (ref 65–99)
Glucose-Capillary: 208 mg/dL — ABNORMAL HIGH (ref 65–99)
Glucose-Capillary: 178 mg/dL — ABNORMAL HIGH (ref 65–99)

## 2015-06-24 NOTE — Care Management Important Message (Signed)
Important Message  Patient Details  Name: Marilynn Raildith M Lafevers MRN: [id number] Date of Birth: August 20, 1922   Medicare Important Message Given:  Yes    Marily MemosLisa M Marlow Hendrie, RN 06/24/2015, 10:28 AM

## 2015-06-24 NOTE — Progress Notes (Signed)
[facility] -  at [facility]   PATIENT NAME: Deborah Snyder    MR#:  [id number]  DATE OF BIRTH:  May 06, 1922  SUBJECTIVE:  CHIEF COMPLAINT: she is awake but disoriented. Pleasantly demented. Finishing her lunch.  REVIEW OF SYSTEMS:  Review of systems unobtainable as patient is demented. DRUG ALLERGIES:   Allergies  Allergen Reactions  . Sulfa Antibiotics     VITALS:  Blood pressure 133/102, pulse 53, temperature 98.6 F (37 C), temperature source Oral, resp. rate 18, height 5\' 7"  (1.702 m), weight 91.264 kg (201 lb 3.2 oz), SpO2 92 %.  PHYSICAL EXAMINATION:  GENERAL:  82 y.o.-year-old patient lying in the bed with no acute distress.  EYES: Pupils equal, round, reactive to light and accommodation. No scleral icterus.  HEENT: Head atraumatic, normocephalic. Oropharynx and nasopharynx clear.  NECK:  Supple, no jugular venous distention. No thyroid enlargement, no tenderness.  LUNGS: Normal breath sounds bilaterally, no wheezing, rales,rhonchi or crepitation. No use of accessory muscles of respiration.  CARDIOVASCULAR: S1, S2 normal. No murmurs, rubs, or gallops.  ABDOMEN: Soft, nontender, nondistended. Bowel sounds present. No organomegaly or mass.  EXTREMITIES: No pedal edema, cyanosis, or clubbing.  NEUROLOGIC: Patient is arousable. Lethargic and has chronic dementia PSYCH-awake but  Not oriented to time and place  Due to dementia,   CBC  Recent Labs Lab 06/24/15 0313  WBC 9.6  HGB 9.5*  HCT 27.9*  PLT 147*   ------------------------------------------------------------------------------------------------------------------  Chemistries   Recent Labs Lab 06/22/15 1848  06/23/15 1611 06/24/15 0313  NA 133*  < > 134* 136  K 4.1  < > 3.8 3.9  CL 101  < > 106 106  CO2 24  < > 23 23  GLUCOSE 288*  < > 82 159*  BUN 26*  < > 22* 24*  CREATININE 1.11*  < > 1.01* 1.03*  CALCIUM 8.4*  < > 7.8* 8.0*  MG  --   --  1.6*  --   AST 37  --    --   --   ALT 31  --   --   --   ALKPHOS 64  --   --   --   BILITOT 1.8*  --   --   --   < > = values in this interval not displayed. ------------------------------------------------------------------------------------------------------------------  Cardiac Enzymes  Recent Labs Lab 06/22/15 1848  TROPONINI 0.20*   ------------------------------------------------------------------------------------------------------------------  RADIOLOGY:  Dg Chest 2 View  06/22/2015  CLINICAL DATA:  Chest pain EXAM: CHEST  2 VIEW COMPARISON:  03/22/2013 FINDINGS: Moderate cardiac enlargement. Vascular pattern normal. No evidence of edema or consolidation. IMPRESSION: Stable cardiac enlargement.  No acute findings. Electronically Signed   By: Esperanza Heiraymond  Rubner M.D.   On: 06/22/2015 19:50    EKG:   Orders placed or performed during the hospital encounter of 06/22/15  . ED EKG  . ED EKG  . EKG 12-Lead  . EKG 12-Lead  . EKG 12-Lead  . EKG 12-Lead    ASSESSMENT AND PLAN:   Sepsis due to UTI: Present on admission. Urine cultures show gram-negative rods. Final data is pending. Continue same. Discontinue vancomycin.   * Atrial fibrillation  Continue rate controlling medication and xarelto.  * Diabetes  Continue long-acting insulin, metformin,  and keep on sliding scale coverage.  * Hypertension  Continue home medication.  * Dementia  This is at baseline, continue home medication.    All the records are reviewed and case discussed with Care  Management/Social Workerr. CALL placed to son, no vm setup  CODE STATUS: FC  TOTAL TIME TAKING CARE OF THIS PATIENT: 35 minutes.   POSSIBLE D/C IN 2 DAYS, DEPENDING ON CLINICAL CONDITION.  Note: This dictation was prepared with Dragon dictation along with smaller phrase technology. Any transcriptional errors that result from this process are unintentional.   Mikell Kazlauskas M.D on 06/24/2015 at 12:55 PM  Between 7am to 6pm - Pager -  [phone] After 6pm go to [email or web address] - password [email or web address] [facility]  [facility]  Office  [phone]  CC: Primary care physician; No PCP Per Patient

## 2015-06-24 NOTE — Evaluation (Signed)
Physical Therapy Evaluation Patient Details Name: Deborah Snyder MRN: [id number] DOB: 05-11-1922 Today's Date: 06/24/2015   History of Present Illness  Deborah Snyder is a 82yo white female who [facility] after being found with tachycardia. PMH: afib, dementia, HTN, HLD. Per caregivers, baseline includes limited household distances with RW and supervision, and total assist for ADL.   Clinical Impression  At evaluation, pt is received semirecumbent in bed upon entry, alert, and very much disoriented/confused. The patient is having visual hallucinations, and is found to being manipulating her self-removes IV in BUE with dried blood on both arms/hands/thighs (RN notified, who reports this has already happened multiple times today.) The patient reports that it is her sister that is really ill, not her, and then asks PT to be quite because the others in the room are trying to sleep, point toward empty chairs (there are no others present). Regardless, pt is agreeable to participate with PT for OOB assessment. Pt's global strength as screened during functional mobility assessment presents with mild to moderate impairment, however is suspected to be somewhat near or at baseline based off of some details from the medical record. The patient is quite distractible, mostly so by not being in her normal clothes/bra/etc,and interrupts mobility tasks several times to try to doff her gown or inspect her hospital gown. The patient requires maxA for bed mobility and minA for transfers from an elevated surface, demonstrating curious and unsafe use of RW.   Patient presenting with impairment of strength, balance, and activity tolerance, but without suspected acute impairment. No additional PT services are indicated at this time. PT signing off. It is not clear at this time that the patient is a good candidate for additional PT services at this time due to her inability to follow commands in conjunction  with her impulsivity. Recommending continued functional mobility with caregivers to prevent further deconditioning, and 24/7 supervision after DC.           Follow Up Recommendations Supervision/Assistance - 24 hour;Supervision for mobility/OOB;Other (comment) (Return [facility] once medically stable. )    Equipment Recommendations  None recommended by PT    Recommendations for Other Services       Precautions / Restrictions Precautions Precautions: Fall      Mobility  Bed Mobility Overal bed mobility: +2 for physical assistance;Needs Assistance Bed Mobility: Supine to Sit;Sit to Supine     Supine to sit: Min assist;HOB elevated Sit to supine: Max assist   General bed mobility comments: Needs heavy assist to scoot toward HOB.   Transfers Overall transfer level: Needs assistance Equipment used: Rolling walker (2 wheeled) Transfers: Sit to/from Stand Sit to Stand: Min assist         General transfer comment: requires multiple attempts, and repated cues to keep on task (easily distracted); fascinating and illogical choice of actions with RW as she prepares to stand. Somewhat steady once standing but seems too distracted and impulsive to attempt gait.   Ambulation/Gait Ambulation/Gait assistance:  (unsafe to attempts)              Information systems managertairs            Wheelchair Mobility    Modified Rankin (Stroke Patients Only)       Balance Overall balance assessment: Needs assistance Sitting-balance support: Bilateral upper extremity supported Sitting balance-Leahy Scale: Poor     Standing balance support: Bilateral upper extremity supported Standing balance-Leahy Scale: Poor  Pertinent Vitals/Pain Pain Assessment: No/denies pain    Home Living Family/patient expects to be discharged to:: Skilled nursing facility                 Additional Comments: [facility]    Prior Function Level of  Independence: Needs assistance   Gait / Transfers Assistance Needed: limited household distances with RW and supervision.   ADL's / Homemaking Assistance Needed: Total assistance for ADL (is able to self feed);        Hand Dominance        Extremity/Trunk Assessment   Upper Extremity Assessment: Generalized weakness           Lower Extremity Assessment: Generalized weakness         Communication   Communication: HOH  Cognition Arousal/Alertness: Awake/alert Behavior During Therapy: Impulsive;Restless;WFL for tasks assessed/performed Overall Cognitive Status: History of cognitive impairments - at baseline (while in room, hallucinating, very confused and disoriented. )       Memory: Decreased short-term memory;Decreased recall of precautions              General Comments      Exercises        Assessment/Plan    PT Assessment Patent does not need any further PT services  PT Diagnosis Generalized weakness;Altered mental status   PT Problem List    PT Treatment Interventions     PT Goals (Current goals can be found in the Care Plan section) Acute Rehab PT Goals PT Goal Formulation: All assessment and education complete, DC therapy    Frequency     Barriers to discharge        Co-evaluation               End of Session Equipment Utilized During Treatment: Gait belt Activity Tolerance: No increased pain;Patient limited by fatigue Patient left: in bed           Time: 1512-1528 PT Time Calculation (min) (ACUTE ONLY): 16 min   Charges:   PT Evaluation $PT Eval High Complexity: 1 Procedure     PT G Codes:        4:44 PM, 07/22/2015 Rosamaria Lints, PT, DPT PRN Physical Therapist - [facility] License # [id number] [phone] (mobile)

## 2015-06-24 NOTE — Progress Notes (Signed)
Patient has been alert, only oriented to self. No complaints of pain. Patient has been very confused and is not easily re-directed. Has pulled out 3 IV's since 6AM this morning. Is not impulsive or aggressive, just does not understand plan of care. Has been afib on tele, rate controlled. Heart rate does get slightly elevated while moving. At this time patient is on no rate control medications. Family members have called and been by to check on status of patient. Pending urine cultures, patient may discharge tomorrow on oral antibiotics.

## 2015-06-24 NOTE — Care Management (Signed)
Sp[facility] with [facility]. At baseline, patient is able to ambulate with a walker. She is dependent with adls. She is able to feed herself.

## 2015-06-25 LAB — GLUCOSE, CAPILLARY
GLUCOSE-CAPILLARY: 127 mg/dL — AB (ref 65–99)
GLUCOSE-CAPILLARY: 226 mg/dL — AB (ref 65–99)
Glucose-Capillary: 136 mg/dL — ABNORMAL HIGH (ref 65–99)
Glucose-Capillary: 166 mg/dL — ABNORMAL HIGH (ref 65–99)

## 2015-06-25 LAB — URINE CULTURE: Culture: >=100000 — AB

## 2015-06-25 MED ORDER — METHYLPREDNISOLONE SODIUM SUCC 125 MG IJ SOLR
60.0000 mg | INTRAMUSCULAR | Status: DC
  Administered 2015-06-25: 60 mg via INTRAVENOUS
  Filled 2015-06-25 (×2): qty 2

## 2015-06-25 MED ORDER — METOPROLOL TARTRATE 25 MG PO TABS
25.0000 mg | ORAL_TABLET | Freq: Two times a day (BID) | ORAL | Status: DC
  Administered 2015-06-25 – 2015-06-26 (×3): 25 mg via ORAL
  Filled 2015-06-25 (×3): qty 1

## 2015-06-25 MED ORDER — DEXTROSE 5 % IV SOLN
1.0000 g | INTRAVENOUS | Status: DC
  Administered 2015-06-25: 1 g via INTRAVENOUS
  Filled 2015-06-25 (×2): qty 10

## 2015-06-25 NOTE — Progress Notes (Signed)
Rechecked O2 sat on ra, 95%. Dr. Luberta MutterKonidena aware of elevated BP, new orders received.

## 2015-06-25 NOTE — Progress Notes (Signed)
CSW attempted to complete assessment. Patient is not oriented. Left a voicemail for Harriett Sineancy- Patient's daughter. Awaiting phone call back.  Deborah Snyder, MSW, LCSW-A Clinical Social Work Department [phone]

## 2015-06-25 NOTE — Progress Notes (Signed)
[facility] [place] at [facility]   PATIENT NAME: Deborah Snyder    MR#:  [id number]  DATE OF BIRTH:  20-Aug-1922  SUBJECTIVE: Has some wheezing today. Awake, but demented.   CHIEF COMPLAINT:   REVIEW OF SYSTEMS:  Review of systems unobtainable as patient is demented. DRUG ALLERGIES:   Allergies  Allergen Reactions  . Sulfa Antibiotics     VITALS:  Blood pressure 198/79, pulse 87, temperature 98.3 F (36.8 C), temperature source Oral, resp. rate 17, height 5\' 7"  (1.702 m), weight 91.264 kg (201 lb 3.2 oz), SpO2 96 %.  PHYSICAL EXAMINATION:  GENERAL:  82 y.o.-year-old patient lying in the bed with no acute distress.  EYES: Pupils equal, round, reactive to light and accommodation. No scleral icterus.  HEENT: Head atraumatic, normocephalic. Oropharynx and nasopharynx clear.  NECK:  Supple, no jugular venous distention. No thyroid enlargement, no tenderness.  LUNGS: billateral expiratory wheezes present both lungs.,  No rales,rhonchi or crepitation. No use of accessory muscles of respiration.  CARDIOVASCULAR: S1, S2 normal. No murmurs, rubs, or gallops.  ABDOMEN: Soft, nontender, nondistended. Bowel sounds present. No organomegaly or mass.  EXTREMITIES: No pedal edema, cyanosis, or clubbing.  NEUROLOGIC: Patient is arousable.  PSYCH-awake but  Not oriented to time and place  Due to dementia,   CBC  Recent Labs Lab 06/24/15 0313  WBC 9.6  HGB 9.5*  HCT 27.9*  PLT 147*   ------------------------------------------------------------------------------------------------------------------  Chemistries   Recent Labs Lab 06/22/15 1848  06/23/15 1611 06/24/15 0313  NA 133*  < > 134* 136  K 4.1  < > 3.8 3.9  CL 101  < > 106 106  CO2 24  < > 23 23  GLUCOSE 288*  < > 82 159*  BUN 26*  < > 22* 24*  CREATININE 1.11*  < > 1.01* 1.03*  CALCIUM 8.4*  < > 7.8* 8.0*  MG  --   --  1.6*  --   AST 37  --   --   --   ALT 31  --   --   --   ALKPHOS 64  --    --   --   BILITOT 1.8*  --   --   --   < > = values in this interval not displayed. ------------------------------------------------------------------------------------------------------------------  Cardiac Enzymes  Recent Labs Lab 06/22/15 1848  TROPONINI 0.20*   ------------------------------------------------------------------------------------------------------------------  RADIOLOGY:  No results found.  EKG:   Orders placed or performed during the hospital encounter of 06/22/15  . ED EKG  . ED EKG  . EKG 12-Lead  . EKG 12-Lead  . EKG 12-Lead  . EKG 12-Lead    ASSESSMENT AND PLAN:   Sepsis due to UTI: Present on admission. Urine cultures show Klebsiella.continue IV Rocephin,  Atrial fibrillation  Continue rate controlling medication and xarelto.  * Diabetes  Continue long-acting insulin, metformin,  and keep on sliding scale coverage.  * Hypertension  unControlled this morning.  bp 198/78. Continue lisinopril,  Add metoprolol. * Dementia  This is at baseline, continue home medication.  D/w daughter  Kennon Portelalikley dc/ am All the records are reviewed and case discussed with Care Management/Social Workerr. CALL placed to son, no vm setup  CODE STATUS: FC  TOTAL TIME TAKING CARE OF THIS PATIENT: 35 minutes.   POSSIBLE D/C IN 2 DAYS, DEPENDING ON CLINICAL CONDITION.  Note: This dictation was prepared with Dragon dictation along with smaller phrase technology. Any transcriptional errors that result from this  process are unintentionalKatha Hamming M.D on 06/25/2015 at 11:37 AM  Between 7am to 6pm - Pager - [phone] After 6pm go to [email or web address] - password [email or web address] [facility]  [place] [facility]  Office  [phone]  CC: Primary care physician; No PCP Per Patient

## 2015-06-25 NOTE — Plan of Care (Signed)
Problem: Education: Goal: Knowledge of [facility] Education information/materials will improve Outcome: Not Progressing Dementia  Problem: Safety: Goal: Ability to remain free from injury will improve Outcome: Progressing Fall precautions in place  Problem: Health Behavior/Discharge Planning: Goal: Ability to manage health-related needs will improve Outcome: Not Progressing dementia  Problem: Physical Regulation: Goal: Will remain free from infection Outcome: Progressing IV antibiotics  Problem: Tissue Perfusion: Goal: Risk factors for ineffective tissue perfusion will decrease Outcome: Progressing Xarelto

## 2015-06-25 NOTE — Discharge Instructions (Signed)

## 2015-06-25 NOTE — Progress Notes (Signed)
Pt had 25 beat run of SVT, VSS, asymptomatic. MD Dr. Joneen Roachrosley notified, no new orders. RN will continue to monitor. Syliva Overmanassie A Deasiah Hagberg, RN

## 2015-06-26 LAB — GLUCOSE, CAPILLARY
Glucose-Capillary: 143 mg/dL — ABNORMAL HIGH (ref 65–99)
Glucose-Capillary: 115 mg/dL — ABNORMAL HIGH (ref 65–99)

## 2015-06-26 MED ORDER — CIPROFLOXACIN HCL 500 MG PO TABS: 500.0000 mg | ORAL_TABLET | Freq: Two times a day (BID) | ORAL | Status: DC

## 2015-06-26 MED ORDER — PREDNISONE 10 MG (21) PO TBPK: 10.0000 mg | ORAL_TABLET | Freq: Every day | ORAL | Status: DC

## 2015-06-26 MED ORDER — CEPHALEXIN 500 MG PO CAPS: 500.0000 mg | ORAL_CAPSULE | Freq: Two times a day (BID) | ORAL | Status: DC

## 2015-06-26 MED ORDER — METOPROLOL TARTRATE 25 MG PO TABS: 25.0000 mg | ORAL_TABLET | Freq: Two times a day (BID) | ORAL | Status: DC

## 2015-06-26 NOTE — Progress Notes (Signed)
Pleasant woman, very confused, forgetful, and HOH.  Have to shout multiple times for her to grasp whats going on.  Follows simple command.  Speaks only when asked to.  No apparent distress.

## 2015-06-26 NOTE — Care Management (Signed)
[facility] with no [facility] health needs.

## 2015-06-26 NOTE — Discharge Summary (Signed)
Deborah Snyder, is a 82 y.o. female  DOB 12-24-1922  MRN [id number].  Admission date:  06/22/2015  Admitting Physician  Altamese DillingVaibhavkumar Vachhani, MD  Discharge Date:  06/26/2015   Primary MD  No PCP Per Patient  Recommendations for primary care physician for things to follow:   Follow-up with primary doctor in 1 week   Admission Diagnosis  NSTEMI (non-ST elevated myocardial infarction) (HCC) [I21.4] Sepsis secondary to UTI (HCC) [A41.9, N39.0]   Discharge Diagnosis  NSTEMI (non-ST elevated myocardial infarction) (HCC) [I21.4] Sepsis secondary to UTI (HCC) [A41.9, N39.0]    Active Problems:   Tachycardia   SIRS (systemic inflammatory response syndrome) (HCC)      Past Medical History  Diagnosis Date  . Dementia   . Edema   . Diabetes (HCC)   . Hypertension   . GERD (gastroesophageal reflux disease)   . Hyperlipidemia   . Atrial fibrillation (HCC)     History reviewed. No pertinent past surgical history.     History of present illness and  Hospital Course:     Kindly see H&P for history of present illness and admission details, please review complete Labs, Consult reports and Test reports for all details in brief  HPI  from the history and physical done on the day of admission;82 year old female patient with dementia, diabetes, history hypertension, hyperlipidemia, atrial fibrillation [facility] once in because of fever, weakness. Temperature 100  F.  Hospital Course    #1 sepsis present on admission secondary to UTI: Urine culture showed klebsiella. Discharging  to assisted living facility with Cipro. . he needs Cipro 500 mg by mouth twice a day for 5 days. Patient did receive Rocephin in the hospital. Blood Cultures are negative.  #2 Alzheimer's dementia ; confused at baseline. 3.  Diabetes mellitus type 2: Patient takes metformin, Lantus 40 units daily at bedtime. #4 reactive airway disease with wheezing: Patient did receive Solu-Medrol. Discharging to assisted living facility with tapering course of prednisone. Patient also tosee her nebulizers. She can continue the albuterol as needed for wheezing. #5 essential hypertension: Uncontrolled.  bp up to 190/106. Patient is on lisinopril. Added metoprolol 25 MG by mouth twice a day in addition. Blood pressure is better at this time. #5 chronic atrial fibrillation: Patient is on Xarelto. Continue metoprolol tartrate 20 MG by mouth twice a day for rate control and also Xarelto.   Discharge Condition:stable   Follow UP  Follow-up Information    Follow up with [facility] [facility] .   Specialty:  Group Home   Contact information:   [address] [place] [id number] [phone]        Discharge Instructions  and  Discharge Medications        Medication List    TAKE these medications        acetaminophen 650 MG CR tablet  Commonly known as:  TYLENOL  Take 650 mg by mouth 3 (three) times daily.     acetaminophen 325 MG tablet  Commonly known as:  TYLENOL  Take 650 mg by mouth every 6 (six) hours as needed for mild pain, moderate pain or fever.     ciprofloxacin 500 MG tablet  Commonly known as:  CIPRO  Take 1 tablet (500 mg total) by mouth 2 (two) times daily.     furosemide 20 MG tablet  Commonly known as:  LASIX  Take 20 mg by mouth daily.  HYDROcodone-acetaminophen 5-325 MG tablet  Commonly known as:  NORCO/VICODIN  Take 0.5-1 tablets by mouth every 6 (six) hours as needed for moderate pain or severe pain.     insulin glargine 100 UNIT/ML injection  Commonly known as:  LANTUS  Inject 20 Units into the skin at bedtime.     latanoprost 0.005 % ophthalmic solution  Commonly known as:  XALATAN  Place 1 drop into both eyes at bedtime.     lisinopril 10 MG tablet   Commonly known as:  PRINIVIL,ZESTRIL  Take 10 mg by mouth daily.     metFORMIN 500 MG tablet  Commonly known as:  GLUCOPHAGE  Take by mouth 2 (two) times daily.     metoprolol tartrate 25 MG tablet  Commonly known as:  LOPRESSOR  Take 1 tablet (25 mg total) by mouth 2 (two) times daily.     omega-3 acid ethyl esters 1 g capsule  Commonly known as:  LOVAZA  Take 1 g by mouth daily.     potassium chloride 10 MEQ tablet  Commonly known as:  K-DUR  Take 10 mEq by mouth daily.     predniSONE 10 MG (21) Tbpk tablet  Commonly known as:  STERAPRED UNI-PAK 21 TAB  Take 1 tablet (10 mg total) by mouth daily. Taper as directed     rivaroxaban 20 MG Tabs tablet  Commonly known as:  XARELTO  Take 20 mg by mouth every morning.     simvastatin 40 MG tablet  Commonly known as:  ZOCOR  Take 40 mg by mouth at bedtime.          Diet and Activity recommendation: See Discharge Instructions above   Consults obtained - none   Major procedures and Radiology Reports - PLEASE review detailed and final reports for all details, in brief -     Dg Chest 2 View  06/22/2015  CLINICAL DATA:  Chest pain EXAM: CHEST  2 VIEW COMPARISON:  03/22/2013 FINDINGS: Moderate cardiac enlargement. Vascular pattern normal. No evidence of edema or consolidation. IMPRESSION: Stable cardiac enlargement.  No acute findings. Electronically Signed   By: Esperanza Heir M.D.   On: 06/22/2015 19:50    Micro Results    Recent Results (from the past 240 hour(s))  Blood culture (routine x 2)     Status: None (Preliminary result)   Collection Time: 06/22/15  7:30 PM  Result Value Ref Range Status   Specimen Description BLOOD RIGHT ASSIST CONTROL  Final   Special Requests   Final    BOTTLES DRAWN AEROBIC AND ANAEROBIC 10CCAERO,5CCANA   Culture NO GROWTH 3 DAYS  Final   Report Status PENDING  Incomplete  Blood culture (routine x 2)     Status: None (Preliminary result)   Collection Time: 06/22/15  7:40 PM   Result Value Ref Range Status   Specimen Description BLOOD LEFT ASSIST CONTROL  Final   Special Requests BOTTLES DRAWN AEROBIC AND ANAEROBIC 5CCAERO,5CCANA  Final   Culture NO GROWTH 3 DAYS  Final   Report Status PENDING  Incomplete  Urine culture     Status: Abnormal   Collection Time: 06/22/15  7:50 PM  Result Value Ref Range Status   Specimen Description URINE, CLEAN CATCH  Final   Special Requests NONE  Final   Culture >=100,000 COLONIES/mL KLEBSIELLA PNEUMONIAE (A)  Final   Report Status 06/25/2015 FINAL  Final   Organism ID, Bacteria KLEBSIELLA PNEUMONIAE (A)  Final      Susceptibility  Klebsiella pneumoniae - MIC*    AMPICILLIN >=32 RESISTANT Resistant     CEFAZOLIN 16 SENSITIVE Sensitive     CEFTRIAXONE <=1 SENSITIVE Sensitive     CIPROFLOXACIN <=0.25 SENSITIVE Sensitive     GENTAMICIN <=1 SENSITIVE Sensitive     IMIPENEM 1 SENSITIVE Sensitive     NITROFURANTOIN 64 INTERMEDIATE Intermediate     TRIMETH/SULFA <=20 SENSITIVE Sensitive     AMPICILLIN/SULBACTAM >=32 RESISTANT Resistant     PIP/TAZO 8 SENSITIVE Sensitive     Extended ESBL NEGATIVE Sensitive     * >=100,000 COLONIES/mL KLEBSIELLA PNEUMONIAE  MRSA PCR Screening     Status: None   Collection Time: 06/22/15 11:32 PM  Result Value Ref Range Status   MRSA by PCR NEGATIVE NEGATIVE Final    Comment:        The GeneXpert MRSA Assay (FDA approved for NASAL specimens only), is one component of a comprehensive MRSA colonization surveillance program. It is not intended to diagnose MRSA infection nor to guide or monitor treatment for MRSA infections.        Today   Subjective:   Jonnie Kubly today has no headache,no chest abdominal pain,no new weakness tingling or numbness, feels much better wants to go home today.   Objective:   Blood pressure 178/85, pulse 65, temperature 97.8 F (36.6 C), temperature source Oral, resp. rate 26, height 5\' 7"  (1.702 m), weight 91.264 kg (201 lb 3.2 oz), SpO2 96  %.   Intake/Output Summary (Last 24 hours) at 06/26/15 0906 Last data filed at 06/25/15 2036  Gross per 24 hour  Intake    103 ml  Output      0 ml  Net    103 ml    Exam Awake   But has present dementia, [place].AT,PERRAL Supple Neck,No JVD, No cervical lymphadenopathy appriciated.  Symmetrical Chest wall movement, Good air movement bilaterally, CTAB RRR,No Gallops,Rubs or new Murmurs, No Parasternal Heave +ve B.Sounds, Abd Soft, Non tender, No organomegaly appriciated, No rebound -guarding or rigidity. No Cyanosis, Clubbing or edema, No new Rash or bruise  Data Review   CBC w Diff: Lab Results  Component Value Date   WBC 9.6 06/24/2015   WBC 9.4 03/22/2013   HGB 9.5* 06/24/2015   HGB 10.9* 03/22/2013   HCT 27.9* 06/24/2015   HCT 32.0* 03/22/2013   PLT 147* 06/24/2015   PLT 209 03/22/2013   LYMPHOPCT 3% 06/22/2015   LYMPHOPCT 10.8 03/22/2013   MONOPCT 6% 06/22/2015   MONOPCT 8.0 03/22/2013   EOSPCT 0% 06/22/2015   EOSPCT 3.5 03/22/2013   BASOPCT 0% 06/22/2015   BASOPCT 0.5 03/22/2013    CMP: Lab Results  Component Value Date   NA 136 06/24/2015   NA 137 03/22/2013   K 3.9 06/24/2015   K 3.8 03/22/2013   CL 106 06/24/2015   CL 106 03/22/2013   CO2 23 06/24/2015   CO2 27 03/22/2013   BUN 24* 06/24/2015   BUN 14 03/22/2013   CREATININE 1.03* 06/24/2015   CREATININE 0.91 03/22/2013   PROT 6.7 06/22/2015   PROT 6.4 03/22/2013   ALBUMIN 3.6 06/22/2015   ALBUMIN 2.9* 03/22/2013   BILITOT 1.8* 06/22/2015   BILITOT 0.3 03/22/2013   ALKPHOS 64 06/22/2015   ALKPHOS 71 03/22/2013   AST 37 06/22/2015   AST 16 03/22/2013   ALT 31 06/22/2015   ALT 13 03/22/2013  .   Total Time in preparing paper work, data evaluation and todays exam - 35 minutes  Elysa Womac  M.D on 06/26/2015 at 9:06 AM    Note: This dictation was prepared with Dragon dictation along with smaller phrase technology. Any transcriptional errors that result from this process are  unintentional.

## 2015-06-26 NOTE — Clinical Social Work Note (Signed)
Clinical Social Work Assessment  Patient Details  Name: Deborah Snyder MRN: [id number] Date of Birth: [birth year]  Date of referral:  06/26/15               Reason for consult:  Discharge Planning                Permission sought to share information with:  Family Supports Permission granted to share information::  Yes, Verbal Permission Granted  Name::        Agency::     Relationship::   Deborah Sine(Deborah Snyder)  SolicitorContact Information:     Housing/Transportation Living arrangements for the past 2 months:  Assisted Living Facility Source of Information:  Adult Children Patient Interpreter Needed:  None Criminal Activity/Legal Involvement Pertinent to Current Situation/Hospitalization:  No - Comment as needed Significant Relationships:  Adult Children, Other Family Members Lives with:  Facility Resident [facility] Do you feel safe going back to the place where you live?  Yes Need for family participation in patient care:  Yes (Comment) Deborah Sine(Deborah Snyder)  Care giving concerns:  [facility]    Social Worker assessment / plan:  CSW called patient's Snyder Deborah Snyder over the phone. Introduced herself and her role. Per Deborah SineNancy patient [facility]. She reports that she'd like to transport patient at discharge. Reported [facility] around lunch time. Granted CSW verbal permssion to coordinate [facility]. CSW [facility] Haz[facility] and left a voicemail. CSW will continue to follow and assist.   Employment status:  Retired Health and safety inspectornsurance information:  Medicare PT Recommendations:  24 Hour Supervision, Home with Home Health Information / Referral to community resources:     Patient/Family's Response to care:  Patient's Snyder is in agreement for [facility] to [facility].   Patient/Family's Understanding of and Emotional Response to Diagnosis, Current Treatment, and Prognosis:  Patient's Snyder Deborah Snyder  stated she's appreciative of CSW's assistance.   Emotional Assessment Appearance:  Appears stated age Attitude/Demeanor/Rapport:   (None) Affect (typically observed):  Calm, Pleasant Orientation:  Oriented to Self Alcohol / Substance use:  Not Applicable Psych involvement (Current and /or in the community):  No (Comment)  Discharge Needs  Concerns to be addressed:  Discharge Planning Concerns Readmission within the last 30 days:  No Current discharge risk:  None Barriers to Discharge:  No Barriers Identified   Verta EllenChristina E Larrissa Stivers, LCSW 06/26/2015, 9:53 AM

## 2015-06-26 NOTE — Progress Notes (Signed)
Clinical Social Worker informed that patient will be medically ready to [facility]. Patient's daughter Harriett Sineancy is in a agreement with plan. CSW [facility] [place] [facility] to [facility] that [facility]'s bed is [facility].  All [facility]. Call to patient's Daughter Nancy,to inform her patient would [facility]. She reports she'll transport patient around lunch time. [facility] via her daughterHarriett Sine- Nancy.  Woodroe Modehristina Yaeko Fazekas, MSW, LCSW-A Clinical Social Work Department [phone]

## 2015-06-26 NOTE — NC FL2 (Signed)
[place] MEDICAID FL2 LEVEL OF CARE SCREENING TOOL     IDENTIFICATION  Patient Name: Deborah Snyder Birthdate: 11/07/1922 Sex: female Admission Date (Current Location): 06/22/2015  [place]   Facility and Address:  [facility], [address], [place], [place] [address]      Provider Number: [id number]  Attending Physician Name and Address:  Katha HammingSnehalatha Konidena, MD  Relative Name and Phone Number:       Current Level of Care: Hospital Recommended Level of Care: Assisted Living Facility Prior Approval Number:    Date Approved/Denied:   PASRR Number:    Discharge Plan:  (ALF)    Current Diagnoses: Patient Active Problem List   Diagnosis Date Noted  . SIRS (systemic inflammatory response syndrome) (HCC) 06/23/2015  . Tachycardia 06/22/2015    Orientation RESPIRATION BLADDER Height & Weight     Self  Normal Incontinent Weight: 201 lb 3.2 oz (91.264 kg) Height:  5\' 7"  (170.2 cm)  BEHAVIORAL SYMPTOMS/MOOD NEUROLOGICAL BOWEL NUTRITION STATUS   (none)  (none) Incontinent Diet (heart healthy carb modified)  AMBULATORY STATUS COMMUNICATION OF NEEDS Skin   Limited Assist Verbally Normal                       Personal Care Assistance Level of Assistance  Bathing, Feeding, Dressing Bathing Assistance: Limited assistance Feeding assistance: Limited assistance Dressing Assistance: Limited assistance     Functional Limitations Info  Sight, Hearing Sight Info: Impaired Hearing Info: Impaired      SPECIAL CARE FACTORS FREQUENCY                       Contractures Contractures Info: Not present    Additional Factors Info  Code Status, Allergies Code Status Info: full Allergies Info: sulfa abx            Discharge Medications: Medication List    TAKE these medications       acetaminophen 650 MG CR tablet  Commonly known as: TYLENOL  Take 650 mg by mouth 3 (three) times daily.      acetaminophen 325 MG tablet  Commonly known as: TYLENOL  Take 650 mg by mouth every 6 (six) hours as needed for mild pain, moderate pain or fever.     ciprofloxacin 500 MG tablet  Commonly known as: CIPRO  Take 1 tablet (500 mg total) by mouth 2 (two) times daily.     furosemide 20 MG tablet  Commonly known as: LASIX  Take 20 mg by mouth daily.     HYDROcodone-acetaminophen 5-325 MG tablet  Commonly known as: NORCO/VICODIN  Take 0.5-1 tablets by mouth every 6 (six) hours as needed for moderate pain or severe pain.     insulin glargine 100 UNIT/ML injection  Commonly known as: LANTUS  Inject 20 Units into the skin at bedtime.     latanoprost 0.005 % ophthalmic solution  Commonly known as: XALATAN  Place 1 drop into both eyes at bedtime.     lisinopril 10 MG tablet  Commonly known as: PRINIVIL,ZESTRIL  Take 10 mg by mouth daily.     metFORMIN 500 MG tablet  Commonly known as: GLUCOPHAGE  Take by mouth 2 (two) times daily.     metoprolol tartrate 25 MG tablet  Commonly known as: LOPRESSOR  Take 1 tablet (25 mg total) by mouth 2 (two) times daily.     omega-3 acid ethyl esters 1 g capsule  Commonly  known as: LOVAZA  Take 1 g by mouth daily.     potassium chloride 10 MEQ tablet  Commonly known as: K-DUR  Take 10 mEq by mouth daily.     predniSONE 10 MG (21) Tbpk tablet  Commonly known as: STERAPRED UNI-PAK 21 TAB  Take 1 tablet (10 mg total) by mouth daily. Taper as directed     rivaroxaban 20 MG Tabs tablet  Commonly known as: XARELTO  Take 20 mg by mouth every morning.     simvastatin 40 MG tablet  Commonly known as: ZOCOR  Take 40 mg by mouth at bedtime.              Relevant Imaging Results:  Relevant Lab Results:   Additional Information    Verta Ellen Deona Novitski, LCSW

## 2015-06-26 NOTE — Progress Notes (Signed)
Patient discharged with daughter who is driving her to the assisted living center.  Discharge instructions and paper work for the assisted living facility.  Education material about new med. Xarelto given to daughter.  Encouraged to establish a mychart account for easy access to records and information.  Accompanied patient to hospital drive to assist getting her into the car.

## 2015-06-27 LAB — CULTURE, BLOOD (ROUTINE X 2)
Culture: NO GROWTH
Culture: NO GROWTH

## 2015-08-26 ENCOUNTER — Observation Stay
Admission: EM | Admit: 2015-08-26 | Discharge: 2015-08-27 | Payer: Medicare Other | Attending: Internal Medicine | Admitting: Internal Medicine

## 2015-08-26 ENCOUNTER — Emergency Department: Payer: Medicare Other

## 2015-08-26 DIAGNOSIS — Z882 Allergy status to sulfonamides status: Secondary | ICD-10-CM | POA: Insufficient documentation

## 2015-08-26 DIAGNOSIS — E785 Hyperlipidemia, unspecified: Secondary | ICD-10-CM | POA: Insufficient documentation

## 2015-08-26 DIAGNOSIS — I493 Ventricular premature depolarization: Secondary | ICD-10-CM | POA: Insufficient documentation

## 2015-08-26 DIAGNOSIS — I119 Hypertensive heart disease without heart failure: Secondary | ICD-10-CM | POA: Insufficient documentation

## 2015-08-26 DIAGNOSIS — F0390 Unspecified dementia without behavioral disturbance: Secondary | ICD-10-CM | POA: Diagnosis not present

## 2015-08-26 DIAGNOSIS — I4891 Unspecified atrial fibrillation: Secondary | ICD-10-CM | POA: Diagnosis present

## 2015-08-26 DIAGNOSIS — Z7901 Long term (current) use of anticoagulants: Secondary | ICD-10-CM | POA: Diagnosis not present

## 2015-08-26 DIAGNOSIS — I482 Chronic atrial fibrillation: Principal | ICD-10-CM | POA: Insufficient documentation

## 2015-08-26 DIAGNOSIS — E119 Type 2 diabetes mellitus without complications: Secondary | ICD-10-CM | POA: Diagnosis not present

## 2015-08-26 DIAGNOSIS — W19XXXA Unspecified fall, initial encounter: Secondary | ICD-10-CM | POA: Diagnosis not present

## 2015-08-26 DIAGNOSIS — I472 Ventricular tachycardia, unspecified: Secondary | ICD-10-CM

## 2015-08-26 DIAGNOSIS — Z794 Long term (current) use of insulin: Secondary | ICD-10-CM | POA: Insufficient documentation

## 2015-08-26 DIAGNOSIS — I7389 Other specified peripheral vascular diseases: Secondary | ICD-10-CM | POA: Insufficient documentation

## 2015-08-26 DIAGNOSIS — K219 Gastro-esophageal reflux disease without esophagitis: Secondary | ICD-10-CM | POA: Diagnosis not present

## 2015-08-26 LAB — CBC
HCT: 36.3 % (ref 35.0–47.0)
Hemoglobin: 11.9 g/dL — ABNORMAL LOW (ref 12.0–16.0)
MCH: 28.8 pg (ref 26.0–34.0)
MCHC: 32.9 g/dL (ref 32.0–36.0)
MCV: 87.6 fL (ref 80.0–100.0)
Platelets: 198 10*3/uL (ref 150–440)
RBC: 4.14 MIL/uL (ref 3.80–5.20)
RDW: 15.3 % — ABNORMAL HIGH (ref 11.5–14.5)
WBC: 9.4 10*3/uL (ref 3.6–11.0)

## 2015-08-26 LAB — BASIC METABOLIC PANEL
Anion gap: 8 (ref 5–15)
BUN: 24 mg/dL — ABNORMAL HIGH (ref 6–20)
CALCIUM: 8.9 mg/dL (ref 8.9–10.3)
CO2: 28 mmol/L (ref 22–32)
CREATININE: 1.16 mg/dL — AB (ref 0.44–1.00)
Chloride: 100 mmol/L — ABNORMAL LOW (ref 101–111)
GFR, EST AFRICAN AMERICAN: 47 mL/min — AB (ref >60)
GFR, EST NON AFRICAN AMERICAN: 40 mL/min — AB (ref >60)
Glucose, Bld: 142 mg/dL — ABNORMAL HIGH (ref 65–99)
Potassium: 4.1 mmol/L (ref 3.5–5.1)
SODIUM: 136 mmol/L (ref 135–145)

## 2015-08-26 LAB — URINALYSIS COMPLETE WITH MICROSCOPIC (ARMC ONLY)
Bilirubin Urine: NEGATIVE
Glucose, UA: NEGATIVE mg/dL
Hgb urine dipstick: NEGATIVE
Ketones, ur: NEGATIVE mg/dL
Nitrite: POSITIVE — AB
PH: 6 (ref 5.0–8.0)
PROTEIN: NEGATIVE mg/dL
Specific Gravity, Urine: 1.019 (ref 1.005–1.030)

## 2015-08-26 LAB — TROPONIN I: Troponin I: 0.03 ng/mL (ref <0.03)

## 2015-08-26 LAB — BRAIN NATRIURETIC PEPTIDE: B NATRIURETIC PEPTIDE 5: 166 pg/mL — AB (ref 0.0–100.0)

## 2015-08-26 LAB — MAGNESIUM: MAGNESIUM: 1.6 mg/dL — AB (ref 1.7–2.4)

## 2015-08-26 MED ORDER — MAGNESIUM SULFATE 2 GM/50ML IV SOLN
2.0000 g | Freq: Once | INTRAVENOUS | Status: AC
  Administered 2015-08-26: 2 g via INTRAVENOUS
  Filled 2015-08-26: qty 50

## 2015-08-26 MED ORDER — SODIUM CHLORIDE 0.9% FLUSH
3.0000 mL | Freq: Two times a day (BID) | INTRAVENOUS | Status: DC
  Administered 2015-08-26 – 2015-08-27 (×2): 3 mL via INTRAVENOUS

## 2015-08-26 MED ORDER — ACETAMINOPHEN 325 MG PO TABS: 650.0000 mg | ORAL_TABLET | Freq: Four times a day (QID) | ORAL | Status: DC | PRN

## 2015-08-26 MED ORDER — ACETAMINOPHEN 650 MG RE SUPP: 650.0000 mg | Freq: Four times a day (QID) | RECTAL | Status: DC | PRN

## 2015-08-26 MED ORDER — METOPROLOL TARTRATE 5 MG/5ML IV SOLN
5.0000 mg | Freq: Once | INTRAVENOUS | Status: AC
  Administered 2015-08-26: 5 mg via INTRAVENOUS
  Filled 2015-08-26: qty 5

## 2015-08-26 NOTE — ED Notes (Signed)
Daughter - Artist PaisCarolyn Bell - Ph# [phone] Son - Sheppard EvensDick Moxley - Ph# [phone]

## 2015-08-26 NOTE — ED Triage Notes (Signed)
Pt transported to ED via EMS. [facility].  Facility told EMS that patient went to lie down for a nap at 3pm and staff found her laying on the floor beside her bed at approx 4pm. Patient reported to staff at the time that she hit her head. Pt is on blood thinners for afib hx. Patient doesn't currently recall falling today. EMS was unable to gain IV access due to patient becoming agitated. BP was 138/96, 113 hr, and CBG of 116.

## 2015-08-26 NOTE — ED Notes (Signed)
Patient transported to CT 

## 2015-08-26 NOTE — H&P (Signed)
[facility] [place] [facility]   PATIENT NAME: Deborah Snyder    MR#:  [id number]  DATE OF BIRTH:  [birth year]   DATE OF ADMISSION:  08/26/2015  PRIMARY CARE PHYSICIAN: No PCP Per Patient   REQUESTING/REFERRING PHYSICIAN: Don PerkingVeronese  CHIEF COMPLAINT:   Chief Complaint  Patient presents with  . Fall    HISTORY OF PRESENT ILLNESS:  Deborah Plumbdith Buckels  is a 82 y.o. female with a known history of Chronic atrial fibrillation as well as dementia presenting after unwitnessed fall. Patient unable to provide meaningful information given mental status which is at baseline. Patient presented from nursing facility after being found floor she states she hit her head later she is unsure what happened. When brought to the Hospital further workup and evaluation she was noted to be tachycardic heart rate 130s to 140s. Received IV metoprolol with improvement of heart rate is now having multiple PVCs  PAST MEDICAL HISTORY:   Past Medical History:  Diagnosis Date  . Atrial fibrillation (HCC)   . Dementia   . Diabetes (HCC)   . Edema   . GERD (gastroesophageal reflux disease)   . Hyperlipidemia   . Hypertension     PAST SURGICAL HISTORY:  History reviewed. No pertinent surgical history.  SOCIAL HISTORY:   Social History  Substance Use Topics  . Smoking status: Never Smoker  . Smokeless tobacco: Never Used  . Alcohol use No    FAMILY HISTORY:   Family History  Problem Relation Age of Onset  . Diabetes Other     DRUG ALLERGIES:   Allergies  Allergen Reactions  . Sulfa Antibiotics     REVIEW OF SYSTEMS:  Unable to accurately obtain given mental status   MEDICATIONS AT HOME:   Prior to Admission medications   Medication Sig Start Date End Date Taking? Authorizing Provider  acetaminophen (TYLENOL) 650 MG CR tablet Take 650 mg by mouth 3 (three) times daily.   Yes Historical Provider, MD  busPIRone (BUSPAR) 5 MG tablet Take 5 mg by mouth daily.   Yes  Historical Provider, MD  furosemide (LASIX) 20 MG tablet Take 20 mg by mouth daily.    Yes Historical Provider, MD  HYDROcodone-acetaminophen (NORCO/VICODIN) 5-325 MG per tablet Take 0.5-1 tablets by mouth every 6 (six) hours as needed for moderate pain or severe pain. Patient taking differently: Take 1 tablet by mouth every 6 (six) hours as needed for severe pain.  08/31/14  Yes Lutricia FeilWilliam P Roemer, PA-C  insulin glargine (LANTUS) 100 UNIT/ML injection Inject 20 Units into the skin at bedtime.    Yes Historical Provider, MD  latanoprost (XALATAN) 0.005 % ophthalmic solution Place 1 drop into both eyes at bedtime.    Yes Historical Provider, MD  lisinopril (PRINIVIL,ZESTRIL) 10 MG tablet Take 10 mg by mouth daily.   Yes Historical Provider, MD  metFORMIN (GLUCOPHAGE) 500 MG tablet Take 500 mg by mouth 2 (two) times daily.    Yes Historical Provider, MD  omega-3 acid ethyl esters (LOVAZA) 1 g capsule Take 1 g by mouth daily.   Yes Historical Provider, MD  potassium chloride (K-DUR) 10 MEQ tablet Take 10 mEq by mouth daily.   Yes Historical Provider, MD  rivaroxaban (XARELTO) 20 MG TABS tablet Take 20 mg by mouth every morning.    Yes Historical Provider, MD  simvastatin (ZOCOR) 40 MG tablet Take 40 mg by mouth at bedtime.    Yes Historical Provider, MD  acetaminophen (TYLENOL) 325 MG tablet Take  650 mg by mouth 3 (three) times daily.     Historical Provider, MD  cephALEXin (KEFLEX) 500 MG capsule Take 1 capsule (500 mg total) by mouth 2 (two) times daily. 06/26/15   Katha HammingSnehalatha Konidena, MD  metoprolol tartrate (LOPRESSOR) 25 MG tablet Take 1 tablet (25 mg total) by mouth 2 (two) times daily. 06/26/15   Katha HammingSnehalatha Konidena, MD  predniSONE (STERAPRED UNI-PAK 21 TAB) 10 MG (21) TBPK tablet Take 1 tablet (10 mg total) by mouth daily. Taper as directed 06/26/15   Katha HammingSnehalatha Konidena, MD      VITAL SIGNS:  Blood pressure 126/83, pulse (!) 106, temperature 98.3 F (36.8 C), temperature source Oral, resp. rate  (!) 22, SpO2 96 %.  PHYSICAL EXAMINATION:  VITAL SIGNS: Vitals:   08/26/15 1845  BP: 126/83  Pulse: (!) 106  Resp: (!) 22  Temp: 98.3 F (36.8 C)   GENERAL:82 y.o.female currently in no acute distress.  HEAD: Normocephalic, atraumatic.  EYES: Pupils equal, round, reactive to light. Extraocular muscles intact. No scleral icterus.  MOUTH: Moist mucosal membrane. Dentition intact. No abscess noted.  EAR, NOSE, THROAT: Clear without exudates. No external lesions.  NECK: Supple. No thyromegaly. No nodules. No JVD.  PULMONARY: Clear to ascultation, without wheeze rails or rhonci. No use of accessory muscles, Good respiratory effort. good air entry bilaterally CHEST: Nontender to palpation.  CARDIOVASCULAR: S1 and S2. Irregular rate and rhythm. No murmurs, rubs, or gallops. No edema. Pedal pulses 2+ bilaterally.  GASTROINTESTINAL: Soft, nontender, nondistended. No masses. Positive bowel sounds. No hepatosplenomegaly.  MUSCULOSKELETAL: No swelling, clubbing, or edema. Range of motion full in all extremities.  NEUROLOGIC: Cranial nerves II through XII are intact. No gross focal neurological deficits. Sensation intact. Reflexes intact.  SKIN: No ulceration, lesions, rashes, or cyanosis. Skin warm and dry. Turgor intact.  PSYCHIATRIC: Mood, affect Pleasantly confused. The patient is awake, alert and oriented self only. Insight, judgment poor.    LABORATORY PANEL:   CBC  Recent Labs Lab 08/26/15 1859  WBC 9.4  HGB 11.9*  HCT 36.3  PLT 198   ------------------------------------------------------------------------------------------------------------------  Chemistries   Recent Labs Lab 08/26/15 1859  NA 136  K 4.1  CL 100*  CO2 28  GLUCOSE 142*  BUN 24*  CREATININE 1.16*  CALCIUM 8.9   ------------------------------------------------------------------------------------------------------------------  Cardiac Enzymes  Recent Labs Lab 08/26/15 1859  TROPONINI 0.03*    ------------------------------------------------------------------------------------------------------------------  RADIOLOGY:  Dg Chest 2 View  Result Date: 08/26/2015 CLINICAL DATA:  Patient found on floor. Patient on blood thinners. Patient in dementia care unit. EXAM: CHEST  2 VIEW COMPARISON:  Two-view chest x-ray 06/22/2015 FINDINGS: Low lung volumes exaggerate the heart size. Mild edema is present. No focal airspace opacifications are present. Atherosclerotic calcifications are present within the aorta. IMPRESSION: 1. Mild cardiomegaly and edema. 2. No focal airspace disease. Electronically Signed   By: Marin Robertshristopher  Mattern M.D.   On: 08/26/2015 20:29   Ct Head Wo Contrast  Result Date: 08/26/2015 CLINICAL DATA:  Status post unwitnessed fall. Headache. Initial encounter. EXAM: CT HEAD WITHOUT CONTRAST TECHNIQUE: Contiguous axial images were obtained from the base of the skull through the vertex without intravenous contrast. COMPARISON:  CT of the head performed earlier today at 7:45 p.m. FINDINGS: There is no evidence of acute infarction, mass lesion, or intra- or extra-axial hemorrhage on CT. Prominence of the ventricles and sulci reflects moderate cortical volume loss. Mild cerebellar atrophy is noted. Scattered periventricular and subcortical white matter change likely reflects small vessel ischemic microangiopathy. The  brainstem and fourth ventricle are within normal limits. The basal ganglia are unremarkable in appearance. The cerebral hemispheres demonstrate grossly normal gray-white differentiation. No mass effect or midline shift is seen. There is no evidence of fracture; visualized osseous structures are unremarkable in appearance. The visualized portions of the orbits are within normal limits. The paranasal sinuses and mastoid air cells are well-aerated. Soft tissue swelling is noted overlying the right parietal calvarium. IMPRESSION: 1. No evidence of traumatic intracranial injury or  fracture. 2. Soft tissue swelling overlying the right parietal calvarium. 3. Moderate cortical volume loss and scattered small vessel ischemic microangiopathy. Electronically Signed   By: Roanna Raider M.D.   On: 08/26/2015 20:30   Ct Cervical Spine Wo Contrast  Result Date: 08/26/2015 CLINICAL DATA:  Unwitnessed fall.  Complains of headache.  Dementia. EXAM: CT CERVICAL SPINE WITHOUT CONTRAST TECHNIQUE: Multidetector CT imaging of the cervical spine was performed without intravenous contrast. Multiplanar CT image reconstructions were also generated. COMPARISON:  CT head reported separately. FINDINGS: The patient was unable to remain motionless for the exam. Small or subtle lesions could be overlooked. Portions of the examination were repeated, and despite best efforts of technologist, motion could not be avoided. There is no visible cervical spine fracture, traumatic subluxation, prevertebral soft tissue swelling, or intraspinal hematoma. Multilevel disc space narrowing. Degenerative anterolisthesis most notable at C5-C6. Lung apices clear. Vascular calcification. No neck masses. IMPRESSION: Motion degraded examination demonstrating no obvious cervical spine fracture or traumatic subluxation Electronically Signed   By: Elsie Stain M.D.   On: 08/26/2015 20:29    EKG:   Orders placed or performed during the hospital encounter of 08/26/15  . EKG 12-Lead  . EKG 12-Lead  . ED EKG  . ED EKG    IMPRESSION AND PLAN:   82 year old Caucasian female history of chronic atrial fibrillation dementia presenting after fall  1. Atrial fibrillation rapid ventricular response received IV metoprolol with improvement this on telemetry continued to follow heart rate 2. Frequent PVCs: Check magnesium level, correct electrolytes 3.. Type 2 diabetes insulin requiring hold oral agents continue sliding scale coverage basal insulin 4. Essential hypertension: Lopressor    All the records are reviewed and case  discussed with ED provider. Management plans discussed with the patient, family and they are in agreement.  CODE STATUS: Full per documentation  TOTAL TIME TAKING CARE OF THIS PATIENT: 33 minutes.    Avier Jech,  Mardi Mainland.D on 08/26/2015 at 8:57 PM  Between 7am to 6pm - Pager - [phone]  After 6pm: House Pager: - [phone]  [facility]  Office  [phone]  CC: Primary care physician; No PCP Per Patient

## 2015-08-26 NOTE — ED Provider Notes (Addendum)
[facility] Emergency Department Provider Note  ____________________________________________  Time seen: Approximately 7:24 PM  I have reviewed the triage vital signs and the nursing notes.   HISTORY  Chief Complaint Fall  Level 5 caveat:  Portions of the history and physical were unable to be obtained due to dementia   HPI Deborah Snyder is a 82 y.o. female a history of CAD s/p stent to LAD 2001, sick sinus syndrome, DVT, atrial fibrillation on Xarelto, dementia, diabetes, hyperlipidemia, hypertension who presents for evaluation of fall. According to EMS patient went to take a nap and he was found on the floor right next to her bed. Patient has no recollection of the fall. Patient told the nursing staff that she had hit her head. She does not remember if she hit her head for me. She denies headache, changes in vision, nausea, vomiting, chest pain, shortness of breath, palpitations, dysuria, hematuria. According to the facility no recent fevers. Patient's children are at the bedside and reports the patient is at her baseline.  Past Medical History:  Diagnosis Date  . Atrial fibrillation (HCC)   . Dementia   . Diabetes (HCC)   . Edema   . GERD (gastroesophageal reflux disease)   . Hyperlipidemia   . Hypertension     Patient Active Problem List   Diagnosis Date Noted  . Atrial fibrillation with rapid ventricular response (HCC) 08/26/2015    History reviewed. No pertinent surgical history.  Prior to Admission medications   Medication Sig Start Date End Date Taking? Authorizing Provider  acetaminophen (TYLENOL) 650 MG CR tablet Take 650 mg by mouth 3 (three) times daily.   Yes Historical Provider, MD  busPIRone (BUSPAR) 5 MG tablet Take 5 mg by mouth daily.   Yes Historical Provider, MD  furosemide (LASIX) 20 MG tablet Take 20 mg by mouth daily.    Yes Historical Provider, MD  HYDROcodone-acetaminophen (NORCO/VICODIN) 5-325 MG per tablet Take 0.5-1  tablets by mouth every 6 (six) hours as needed for moderate pain or severe pain. Patient taking differently: Take 1 tablet by mouth every 6 (six) hours as needed for severe pain.  08/31/14  Yes Lutricia Feil, PA-C  insulin glargine (LANTUS) 100 UNIT/ML injection Inject 20 Units into the skin at bedtime.    Yes Historical Provider, MD  latanoprost (XALATAN) 0.005 % ophthalmic solution Place 1 drop into both eyes at bedtime.    Yes Historical Provider, MD  lisinopril (PRINIVIL,ZESTRIL) 10 MG tablet Take 10 mg by mouth daily.   Yes Historical Provider, MD  metFORMIN (GLUCOPHAGE) 500 MG tablet Take 500 mg by mouth 2 (two) times daily.    Yes Historical Provider, MD  omega-3 acid ethyl esters (LOVAZA) 1 g capsule Take 1 g by mouth daily.   Yes Historical Provider, MD  potassium chloride (K-DUR) 10 MEQ tablet Take 10 mEq by mouth daily.   Yes Historical Provider, MD  rivaroxaban (XARELTO) 20 MG TABS tablet Take 20 mg by mouth every morning.    Yes Historical Provider, MD  simvastatin (ZOCOR) 40 MG tablet Take 40 mg by mouth at bedtime.    Yes Historical Provider, MD  acetaminophen (TYLENOL) 325 MG tablet Take 650 mg by mouth 3 (three) times daily.     Historical Provider, MD  cephALEXin (KEFLEX) 500 MG capsule Take 1 capsule (500 mg total) by mouth 2 (two) times daily. 06/26/15   Katha Hamming, MD  metoprolol tartrate (LOPRESSOR) 25 MG tablet Take 1 tablet (25 mg  total) by mouth 2 (two) times daily. 06/26/15   Katha HammingSnehalatha Konidena, MD  predniSONE (STERAPRED UNI-PAK 21 TAB) 10 MG (21) TBPK tablet Take 1 tablet (10 mg total) by mouth daily. Taper as directed 06/26/15   Katha HammingSnehalatha Konidena, MD    Allergies Sulfa antibiotics  Family History  Problem Relation Age of Onset  . Diabetes Other     Social History Social History  Substance Use Topics  . Smoking status: Never Smoker  . Smokeless tobacco: Never Used  . Alcohol use No    Review of Systems  Constitutional: Negative for fever. Eyes:  Negative for visual changes. ENT: Negative for sore throat. Cardiovascular: Negative for chest pain. Respiratory: Negative for shortness of breath. Gastrointestinal: Negative for abdominal pain, vomiting or diarrhea. Genitourinary: Negative for dysuria. Musculoskeletal: Negative for back pain. Skin: Negative for rash. Neurological: Negative for headaches, weakness or numbness.  ____________________________________________   PHYSICAL EXAM:  VITAL SIGNS: ED Triage Vitals [08/26/15 1845]  Enc Vitals Group     BP 126/83     Pulse Rate (!) 106     Resp (!) 22     Temp 98.3 F (36.8 C)     Temp Source Oral     SpO2 96 %     Weight      Height      Head Circumference      Peak Flow      Pain Score      Pain Loc      Pain Edu?      Excl. in GC?     Constitutional: Alert and oriented. Well appearing and in no apparent distress. HEENT:      Head: Normocephalic and atraumatic.         Eyes: Conjunctivae are normal. Sclera is non-icteric. EOMI. PERRL      Mouth/Throat: Mucous membranes are moist.       Neck: Supple with no signs of meningismus. Cardiovascular: Irregularly irregular rhythm, tachycardic. No murmurs, gallops, or rubs. 2+ symmetrical distal pulses are present in all extremities. No JVD. Respiratory: Normal respiratory effort. Lungs are clear to auscultation bilaterally. No wheezes, crackles, or rhonchi.  Gastrointestinal: Soft, non tender, and non distended with positive bowel sounds. No rebound or guarding. Genitourinary: No CVA tenderness. Musculoskeletal: Nontender with normal range of motion in all extremities. No edema, cyanosis, or erythema of extremities. Neurologic: Normal speech and language. Face is symmetric. Moving all extremities. No gross focal neurologic deficits are appreciated. Skin: Skin is warm, dry and intact. No rash noted. Psychiatric: Mood and affect are normal. Speech and behavior are normal.  ____________________________________________     LABS (all labs ordered are listed, but only abnormal results are displayed)  Labs Reviewed  BASIC METABOLIC PANEL - Abnormal; Notable for the following:       Result Value   Chloride 100 (*)    Glucose, Bld 142 (*)    BUN 24 (*)    Creatinine, Ser 1.16 (*)    GFR calc non Af Amer 40 (*)    GFR calc Af Amer 47 (*)    All other components within normal limits  CBC - Abnormal; Notable for the following:    Hemoglobin 11.9 (*)    RDW 15.3 (*)    All other components within normal limits  TROPONIN I - Abnormal; Notable for the following:    Troponin I 0.03 (*)    All other components within normal limits  URINALYSIS COMPLETEWITH MICROSCOPIC ([facility] ONLY)  MAGNESIUM  BRAIN  NATRIURETIC PEPTIDE  CBG MONITORING, ED   ____________________________________________  EKG  ED ECG REPORT I, Nita Sicklearolina Breslin Burklow, the attending physician, personally viewed and interpreted this ECG.  Atrial fibrillation with multiple 2 beat runs of V. Tach, rate of 104, normal QTC, right axis deviation, no ST elevations or depressions. New from prior. ____________________________________________  RADIOLOGY  CXR: 1. Mild cardiomegaly and edema. 2. No focal airspace disease. ____________________________________________   PROCEDURES  Procedure(s) performed: None Procedures Critical Care performed:  None ____________________________________________   INITIAL IMPRESSION / ASSESSMENT AND PLAN / ED COURSE  82 y.o. female a history of CAD s/p stent to LAD 2001, sick sinus syndrome, DVT, atrial fibrillation on Xarelto, dementia, diabetes, hyperlipidemia, hypertension who presents for evaluation of unwitnessed fall. Patient has no complaints at this time and is neurologically intact. EKG and telemetry showing multiple 2-3 beats of Vtach, patient currently in afib. Review of medication list show the patient is currently on Lopressor 25 mg twice a day, we'll give a dose of IV metoprolol for rate control as her  heart rate has been going up to the 130s. We'll also give magnesium for the runs of V. tach. Labs are pending. Head CT and CT cervical spine pending. Anticipate admission.  Clinical Course   _________________________ 8:45 PM on 08/26/2015 -----------------------------------------  HR better controlled in the 90s after IV metoprolol. Patient continues to have runs of Vtach. She is otherwise asymptomatic and HD stable. Labs showing mild AKI. Troponin borderline positive. CXR with mild cardiomegaly and edema, patient with no new oxygen requirement, denies CP and SOB. Discussed with Hospitalist for admission.  Pertinent labs & imaging results that were available during my care of the patient were reviewed by me and considered in my medical decision making (see chart for details).    ____________________________________________   FINAL CLINICAL IMPRESSION(S) / ED DIAGNOSES  Final diagnoses:  Fall, initial encounter  V-tach [facility](HCC)  Atrial fibrillation with RVR (HCC)      NEW MEDICATIONS STARTED DURING THIS VISIT:  New Prescriptions   No medications on file     Note:  This document was prepared using Dragon voice recognition software and may include unintentional dictation errors.    Nita Sicklearolina Atziry Baranski, MD 08/26/15 2100    Nita Sicklearolina Jaycob Mcclenton, MD 08/26/15 2100

## 2015-08-27 ENCOUNTER — Encounter: Payer: Self-pay | Admitting: *Deleted

## 2015-08-27 DIAGNOSIS — I482 Chronic atrial fibrillation: Secondary | ICD-10-CM | POA: Diagnosis not present

## 2015-08-27 LAB — GLUCOSE, CAPILLARY
GLUCOSE-CAPILLARY: 160 mg/dL — AB (ref 65–99)
Glucose-Capillary: 146 mg/dL — ABNORMAL HIGH (ref 65–99)

## 2015-08-27 LAB — HEMOGLOBIN A1C: HEMOGLOBIN A1C: 6.7 % — AB (ref 4.0–6.0)

## 2015-08-27 LAB — MRSA PCR SCREENING: MRSA BY PCR: NEGATIVE

## 2015-08-27 MED ORDER — RIVAROXABAN 20 MG PO TABS: 20.0000 mg | ORAL_TABLET | Freq: Every day | ORAL | Status: DC

## 2015-08-27 MED ORDER — LATANOPROST 0.005 % OP SOLN
1.0000 [drp] | Freq: Every day | OPHTHALMIC | Status: DC
  Administered 2015-08-27: 1 [drp] via OPHTHALMIC
  Filled 2015-08-27: qty 2.5

## 2015-08-27 MED ORDER — BUSPIRONE HCL 5 MG PO TABS
5.0000 mg | ORAL_TABLET | Freq: Every day | ORAL | Status: DC
  Administered 2015-08-27: 5 mg via ORAL
  Filled 2015-08-27: qty 1

## 2015-08-27 MED ORDER — POTASSIUM CHLORIDE CRYS ER 10 MEQ PO TBCR
10.0000 meq | EXTENDED_RELEASE_TABLET | Freq: Every day | ORAL | Status: DC
  Administered 2015-08-27: 10 meq via ORAL
  Filled 2015-08-27: qty 1

## 2015-08-27 MED ORDER — INSULIN ASPART 100 UNIT/ML ~~LOC~~ SOLN: 0.0000 [IU] | Freq: Every day | SUBCUTANEOUS | Status: DC

## 2015-08-27 MED ORDER — METOPROLOL TARTRATE 25 MG PO TABS
25.0000 mg | ORAL_TABLET | Freq: Two times a day (BID) | ORAL | Status: DC
  Administered 2015-08-27 (×2): 25 mg via ORAL
  Filled 2015-08-27 (×2): qty 1

## 2015-08-27 MED ORDER — FUROSEMIDE 40 MG PO TABS
20.0000 mg | ORAL_TABLET | Freq: Every day | ORAL | Status: DC
  Administered 2015-08-27: 20 mg via ORAL
  Filled 2015-08-27: qty 1

## 2015-08-27 MED ORDER — INSULIN GLARGINE 100 UNIT/ML ~~LOC~~ SOLN
20.0000 [IU] | Freq: Every day | SUBCUTANEOUS | Status: DC
  Administered 2015-08-27: 20 [IU] via SUBCUTANEOUS
  Filled 2015-08-27 (×2): qty 0.2

## 2015-08-27 MED ORDER — INSULIN ASPART 100 UNIT/ML ~~LOC~~ SOLN
0.0000 [IU] | Freq: Three times a day (TID) | SUBCUTANEOUS | Status: DC
Start: 2015-08-27 — End: 2015-08-27
  Administered 2015-08-27: 2 [IU] via SUBCUTANEOUS
  Administered 2015-08-27: 1 [IU] via SUBCUTANEOUS
  Filled 2015-08-27: qty 2
  Filled 2015-08-27: qty 1

## 2015-08-27 MED ORDER — OMEGA-3-ACID ETHYL ESTERS 1 G PO CAPS
1.0000 g | ORAL_CAPSULE | Freq: Every day | ORAL | Status: DC
  Administered 2015-08-27: 1 g via ORAL
  Filled 2015-08-27: qty 1

## 2015-08-27 MED ORDER — RIVAROXABAN 15 MG PO TABS
15.0000 mg | ORAL_TABLET | Freq: Every day | ORAL | Status: DC
  Administered 2015-08-27: 15 mg via ORAL
  Filled 2015-08-27: qty 1

## 2015-08-27 MED ORDER — HYDROCODONE-ACETAMINOPHEN 5-325 MG PO TABS: 1.0000 | ORAL_TABLET | Freq: Four times a day (QID) | ORAL | Status: DC | PRN

## 2015-08-27 MED ORDER — LISINOPRIL 10 MG PO TABS
10.0000 mg | ORAL_TABLET | Freq: Every day | ORAL | Status: DC
  Administered 2015-08-27: 10 mg via ORAL
  Filled 2015-08-27: qty 1

## 2015-08-27 MED ORDER — CEPHALEXIN 500 MG PO CAPS: 500.0000 mg | ORAL_CAPSULE | Freq: Two times a day (BID) | ORAL | Status: DC

## 2015-08-27 MED ORDER — SIMVASTATIN 40 MG PO TABS
40.0000 mg | ORAL_TABLET | Freq: Every day | ORAL | Status: DC
  Administered 2015-08-27: 40 mg via ORAL
  Filled 2015-08-27: qty 1

## 2015-08-27 NOTE — Progress Notes (Signed)
Rivaroxaban dose adjusted to 15 mg po daily with breakfast for creatinine clearance 15 to 50 mL/min. Please call pharmacy if you have questions about this renal dose adjustment.   Nishaan Stanke A. Waureganookson, Vermont[place]arm.D., BCPS Clinical Pharmacist 08/27/2015 [phone]

## 2015-08-27 NOTE — Clinical Social Work Note (Signed)
Clinical Social Work Assessment  Patient Details  Name: Deborah Snyder MRN: [id number] Date of Birth: 23-Oct-1922  Date of referral:  08/27/15               Reason for consult:  Facility Placement                Permission sought to share information with:  Facility Medical sales representativeContact Representative, Family Supports Permission granted to share information::  Yes, Verbal Permission Granted  Name::     Deborah Snyder,Deborah Snyder Daughter [phone] or [phone]   Agency::  [facility]  Relationship::     Contact Information:     Housing/Transportation Living arrangements for the past 2 months:  Assisted Living Facility Source of Information:  Adult Children Patient Interpreter Needed:  None Criminal Activity/Legal Involvement Pertinent to Current Situation/Hospitalization:  No - Comment as needed Significant Relationships:  Adult Children Lives with:  Facility Resident Do you feel safe going back to the place where you live?  Yes Need for family participation in patient care:  Yes (Comment) (Patient has some dementia)  Care giving concerns:  Patient's family did not express any concerns [facility]   Social Worker assessment / plan: Patient is a 82 year old female who has dementia [facility]  Patient's daughter was at bedside, MSW spoke to her to complete assessment due to patient being confused.  MSW explained role and process for facilitating discharge back to ALF.  Patient's daughter states that she plans to have patient return back to ALF.  Patient's daughter stated patient has been at ALF for about 1.5 years and she is on the memory care unit and she gets extra care there.  Patient's daughter expressed she did not have any other questions or concerns.  Employment status:  Retired Health and safety inspectornsurance information:  Medicare PT Recommendations:  Not assessed at this time Information / Referral to community resources:     Patient/Family's Response to care:   Patient and family are in agreement to having patient return back to ALF.  Patient/Family's Understanding of and Emotional Response to Diagnosis, Current Treatment, and Prognosis:  Patient was pleasant and not aware of diagnosis.  Patient's daughter was aware of current prognosis.  Emotional Assessment Appearance:  Appears stated age Attitude/Demeanor/Rapport:    Affect (typically observed):  Calm, Pleasant Orientation:  Oriented to Self Alcohol / Substance use:  Not Applicable Psych involvement (Current and /or in the community):  No (Comment)  Discharge Needs  Concerns to be addressed:  No discharge needs identified Readmission within the last 30 days:  No Current discharge risk:  None Barriers to Discharge:  No Barriers Identified   Darleene Cleavernterhaus, Suleman Gunning R 08/27/2015, 1:59 PM

## 2015-08-27 NOTE — Care Management Obs Status (Signed)
MEDICARE OBSERVATION STATUS NOTIFICATION   Patient Details  Name: Marilynn Raildith M Santone MRN: [id number] Date of Birth: Jun 02, 1922   Medicare Observation Status Notification Given:  Yes    Marily MemosLisa M Stavroula Rohde, RN 08/27/2015, 9:29 AM

## 2015-08-27 NOTE — Discharge Summary (Signed)
[facility] [place] [facility]   PATIENT NAME: Deborah Snyder    MR#:  [id number]  DATE OF BIRTH:  April 18, 1922  DATE OF ADMISSION:  08/26/2015 ADMITTING PHYSICIAN: Wyatt Hasteavid K Hower, MD  DATE OF DISCHARGE: 08/27/15  PRIMARY CARE PHYSICIAN: No PCP Per Patient    ADMISSION DIAGNOSIS:  V-tach (HCC) [I47.2] Atrial fibrillation with RVR (HCC) [I48.91] Fall, initial encounter [W19.XXXA]  DISCHARGE DIAGNOSIS:  Mechanical fall at [facility] Acute on chronic afib-improved Advance dementia  SECONDARY DIAGNOSIS:   Past Medical History:  Diagnosis Date  . Atrial fibrillation (HCC)   . Dementia   . Diabetes (HCC)   . Edema   . GERD (gastroesophageal reflux disease)   . Hyperlipidemia   . Hypertension     HOSPITAL COURSE:   82 year old Caucasian female history of chronic atrial fibrillation dementia presenting after fall  1. Atrial fibrillation rapid ventricular response received IV metoprolol with improvement this on telemetry continued to follow heart rate -HR stable Cont BB and lisinoril -on po xarelto -f/u cardiology as outpt  2. Frequent PVCs:  Replete Mag, recievd IV dosing here  3.. Type 2 diabetes insulin requiring hold oral agents continue sliding scale coverage basal insulin  4. Essential hypertension: Lopressor and lisinopril  5. Mechanical fall CT head neg for trauma Soft tissue swelling at right pareital calvarium No neuro deficits. Baseline confusion due to dementia  Pt uses walker at facility if she feels like. Per dter she will not be able to participate in intensive PT therapy due to dementia.  Overall appears at baseline D/c back to [facility]  CONSULTS OBTAINED:    DRUG ALLERGIES:   Allergies  Allergen Reactions  . Sulfa Antibiotics     DISCHARGE MEDICATIONS:   Current Discharge Medication List    CONTINUE these medications which have NOT CHANGED   Details  acetaminophen (TYLENOL) 650 MG CR tablet Take  650 mg by mouth 3 (three) times daily.    busPIRone (BUSPAR) 5 MG tablet Take 5 mg by mouth daily.    furosemide (LASIX) 20 MG tablet Take 20 mg by mouth daily.     HYDROcodone-acetaminophen (NORCO/VICODIN) 5-325 MG per tablet Take 0.5-1 tablets by mouth every 6 (six) hours as needed for moderate pain or severe pain. Qty: 10 tablet, Refills: 0    insulin glargine (LANTUS) 100 UNIT/ML injection Inject 20 Units into the skin at bedtime.     latanoprost (XALATAN) 0.005 % ophthalmic solution Place 1 drop into both eyes at bedtime.     lisinopril (PRINIVIL,ZESTRIL) 10 MG tablet Take 10 mg by mouth daily.    metFORMIN (GLUCOPHAGE) 500 MG tablet Take 500 mg by mouth 2 (two) times daily.     omega-3 acid ethyl esters (LOVAZA) 1 g capsule Take 1 g by mouth daily.    potassium chloride (K-DUR) 10 MEQ tablet Take 10 mEq by mouth daily.    rivaroxaban (XARELTO) 20 MG TABS tablet Take 20 mg by mouth every morning.     simvastatin (ZOCOR) 40 MG tablet Take 40 mg by mouth at bedtime.     acetaminophen (TYLENOL) 325 MG tablet Take 650 mg by mouth 3 (three) times daily.     cephALEXin (KEFLEX) 500 MG capsule Take 1 capsule (500 mg total) by mouth 2 (two) times daily. Qty: 14 capsule, Refills: 0    metoprolol tartrate (LOPRESSOR) 25 MG tablet Take 1 tablet (25 mg total) by mouth 2 (two) times daily. Qty: 30 tablet, Refills: 0  predniSONE (STERAPRED UNI-PAK 21 TAB) 10 MG (21) TBPK tablet Take 1 tablet (10 mg total) by mouth daily. Taper as directed Qty: 21 tablet, Refills: 0        If you experience worsening of your admission symptoms, develop shortness of breath, life threatening emergency, suicidal or homicidal thoughts you must seek medical attention immediately by calling 911 or calling your MD immediately  if symptoms less severe.  You Must read complete instructions/literature along with all the possible adverse reactions/side effects for all the Medicines you take and that have been  prescribed to you. Take any new Medicines after you have completely understood and accept all the possible adverse reactions/side effects.   Please note  You were cared for by a hospitalist during your hospital stay. If you have any questions about your discharge medications or the care you received while you were in the hospital after you are discharged, you can call the unit and asked to speak with the hospitalist on call if the hospitalist that took care of you is not available. Once you are discharged, your primary care physician will handle any further medical issues. Please note that NO REFILLS for any discharge medications will be authorized once you are discharged, as it is imperative that you return to your primary care physician (or establish a relationship with a primary care physician if you do not have one) for your aftercare needs so that they can reassess your need for medications and monitor your lab values. Today   SUBJECTIVE   Eating lunch. dter in the room  VITAL SIGNS:  Blood pressure (!) 114/56, pulse 79, temperature 98 F (36.7 C), temperature source Oral, resp. rate 18, height 5\' 7"  (1.702 m), weight 196 lb 11.2 oz (89.2 kg), SpO2 96 %.  I/O:   Intake/Output Summary (Last 24 hours) at 08/27/15 1309 Last data filed at 08/27/15 0900  Gross per 24 hour  Intake              240 ml  Output                0 ml  Net              240 ml    PHYSICAL EXAMINATION:  GENERAL:  82 y.o.-year-old patient lying in the bed with no acute distress.  EYES: Pupils equal, round, reactive to light and accommodation. No scleral icterus. Extraocular muscles intact.  HEENT: Head atraumatic, normocephalic. Oropharynx and nasopharynx clear.  NECK:  Supple, no jugular venous distention. No thyroid enlargement, no tenderness.  LUNGS: Normal breath sounds bilaterally, no wheezing, rales,rhonchi or crepitation. No use of accessory muscles of respiration.  CARDIOVASCULAR: S1, S2 normal. No  murmurs, rubs, or gallops. irregulary irregular ABDOMEN: Soft, non-tender, non-distended. Bowel sounds present. No organomegaly or mass.  EXTREMITIES: No pedal edema, cyanosis, or clubbing.  NEUROLOGIC: Cranial nerves II through XII are intact. Muscle strength 5/5 in all extremities. Sensation intact. Gait not checked.  PSYCHIATRIC: The patient is alert and oriented x 3.  SKIN: No obvious rash, lesion, or ulcer.   DATA REVIEW:   CBC   Recent Labs Lab 08/26/15 1859  WBC 9.4  HGB 11.9*  HCT 36.3  PLT 198    Chemistries   Recent Labs Lab 08/26/15 1859  NA 136  K 4.1  CL 100*  CO2 28  GLUCOSE 142*  BUN 24*  CREATININE 1.16*  CALCIUM 8.9  MG 1.6*    Microbiology Results   Recent Results (from  the past 240 hour(s))  MRSA PCR Screening     Status: None   Collection Time: 08/27/15 12:32 AM  Result Value Ref Range Status   MRSA by PCR NEGATIVE NEGATIVE Final    Comment:        The GeneXpert MRSA Assay (FDA approved for NASAL specimens only), is one component of a comprehensive MRSA colonization surveillance program. It is not intended to diagnose MRSA infection nor to guide or monitor treatment for MRSA infections.     RADIOLOGY:  Dg Chest 2 View  Result Date: 08/26/2015 CLINICAL DATA:  Patient found on floor. Patient on blood thinners. Patient in dementia care unit. EXAM: CHEST  2 VIEW COMPARISON:  Two-view chest x-ray 06/22/2015 FINDINGS: Low lung volumes exaggerate the heart size. Mild edema is present. No focal airspace opacifications are present. Atherosclerotic calcifications are present within the aorta. IMPRESSION: 1. Mild cardiomegaly and edema. 2. No focal airspace disease. Electronically Signed   By: Marin Roberts M.D.   On: 08/26/2015 20:29   Ct Head Wo Contrast  Result Date: 08/26/2015 CLINICAL DATA:  Status post unwitnessed fall. Headache. Initial encounter. EXAM: CT HEAD WITHOUT CONTRAST TECHNIQUE: Contiguous axial images were obtained from  the base of the skull through the vertex without intravenous contrast. COMPARISON:  CT of the head performed earlier today at 7:45 p.m. FINDINGS: There is no evidence of acute infarction, mass lesion, or intra- or extra-axial hemorrhage on CT. Prominence of the ventricles and sulci reflects moderate cortical volume loss. Mild cerebellar atrophy is noted. Scattered periventricular and subcortical white matter change likely reflects small vessel ischemic microangiopathy. The brainstem and fourth ventricle are within normal limits. The basal ganglia are unremarkable in appearance. The cerebral hemispheres demonstrate grossly normal gray-white differentiation. No mass effect or midline shift is seen. There is no evidence of fracture; visualized osseous structures are unremarkable in appearance. The visualized portions of the orbits are within normal limits. The paranasal sinuses and mastoid air cells are well-aerated. Soft tissue swelling is noted overlying the right parietal calvarium. IMPRESSION: 1. No evidence of traumatic intracranial injury or fracture. 2. Soft tissue swelling overlying the right parietal calvarium. 3. Moderate cortical volume loss and scattered small vessel ischemic microangiopathy. Electronically Signed   By: Roanna Raider M.D.   On: 08/26/2015 20:30   Ct Cervical Spine Wo Contrast  Result Date: 08/26/2015 CLINICAL DATA:  Unwitnessed fall.  Complains of headache.  Dementia. EXAM: CT CERVICAL SPINE WITHOUT CONTRAST TECHNIQUE: Multidetector CT imaging of the cervical spine was performed without intravenous contrast. Multiplanar CT image reconstructions were also generated. COMPARISON:  CT head reported separately. FINDINGS: The patient was unable to remain motionless for the exam. Small or subtle lesions could be overlooked. Portions of the examination were repeated, and despite best efforts of technologist, motion could not be avoided. There is no visible cervical spine fracture, traumatic  subluxation, prevertebral soft tissue swelling, or intraspinal hematoma. Multilevel disc space narrowing. Degenerative anterolisthesis most notable at C5-C6. Lung apices clear. Vascular calcification. No neck masses. IMPRESSION: Motion degraded examination demonstrating no obvious cervical spine fracture or traumatic subluxation Electronically Signed   By: Elsie Stain M.D.   On: 08/26/2015 20:29     Management plans discussed with the patient, family and they are in agreement.  CODE STATUS:     Code Status Orders        Start     Ordered   08/26/15 2051  Full code  Continuous     08/26/15 2050  Code Status History    Date Active Date Inactive Code Status Order ID Comments User Context   06/22/2015 10:44 PM 06/26/2015  4:57 PM Full Code [id number]  Altamese Dilling, MD Inpatient    Advance Directive Documentation   Flowsheet Row Most Recent Value  Type of Advance Directive  Healthcare Power of Attorney  Pre-existing out of facility DNR order (yellow form or pink MOST form)  No data  "MOST" Form in Place?  No data      TOTAL TIME TAKING CARE OF THIS PATIENT: 40 minutes.    Keirston Saephanh M.D on 08/27/2015 at 1:09 PM  Between 7am to 6pm - Pager - [phone] After 6pm go to [email or web address] - password [email or web address] [facility]  [facility]  Office  [phone]  CC: Primary care physician; No PCP Per Patient

## 2015-08-27 NOTE — Progress Notes (Signed)
Discharge instructions given to patient's daughter. IV and tele removed. Discharge summary faxed to facility by social worker with new keflex medication for UTI. Informed by social work that it is not needed to call report since patient has been here for less than 24 hours. No questions at this time, daughter verbalized understanding.

## 2015-08-27 NOTE — Care Management (Signed)
Patient in with fall and  A-fib with RVR. Presents from [facility]. Spoke with daughter, Artist PaisCarolyn Bell. Explained Medicare Observation Notice in detail.. Daughter verbalized understanding. CSW updated

## 2015-09-04 LAB — GLUCOSE, CAPILLARY: GLUCOSE-CAPILLARY: 177 mg/dL — AB (ref 65–99)

## 2016-02-13 ENCOUNTER — Emergency Department
Admission: EM | Admit: 2016-02-13 | Discharge: 2016-02-13 | Disposition: A | Discharge status: Home / Self Care | Payer: Medicare Other | Attending: Student in an Organized Health Care Education/Training Program | Admitting: Student in an Organized Health Care Education/Training Program

## 2016-02-13 ENCOUNTER — Emergency Department: Payer: Medicare Other

## 2016-02-13 ENCOUNTER — Encounter: Payer: Self-pay | Admitting: Emergency Medicine

## 2016-02-13 DIAGNOSIS — E119 Type 2 diabetes mellitus without complications: Secondary | ICD-10-CM | POA: Diagnosis not present

## 2016-02-13 DIAGNOSIS — W1839XA Other fall on same level, initial encounter: Secondary | ICD-10-CM | POA: Diagnosis not present

## 2016-02-13 DIAGNOSIS — Y939 Activity, unspecified: Secondary | ICD-10-CM | POA: Insufficient documentation

## 2016-02-13 DIAGNOSIS — Z79899 Other long term (current) drug therapy: Secondary | ICD-10-CM | POA: Insufficient documentation

## 2016-02-13 DIAGNOSIS — M545 Low back pain, unspecified: Secondary | ICD-10-CM

## 2016-02-13 DIAGNOSIS — G8929 Other chronic pain: Secondary | ICD-10-CM

## 2016-02-13 DIAGNOSIS — Y999 Unspecified external cause status: Secondary | ICD-10-CM | POA: Diagnosis not present

## 2016-02-13 DIAGNOSIS — I10 Essential (primary) hypertension: Secondary | ICD-10-CM | POA: Insufficient documentation

## 2016-02-13 DIAGNOSIS — Z794 Long term (current) use of insulin: Secondary | ICD-10-CM | POA: Insufficient documentation

## 2016-02-13 DIAGNOSIS — Y92129 Unspecified place in nursing home as the place of occurrence of the external cause: Secondary | ICD-10-CM | POA: Diagnosis not present

## 2016-02-13 DIAGNOSIS — W19XXXA Unspecified fall, initial encounter: Secondary | ICD-10-CM

## 2016-02-13 DIAGNOSIS — S3992XA Unspecified injury of lower back, initial encounter: Secondary | ICD-10-CM | POA: Diagnosis present

## 2016-02-13 MED ORDER — ACETAMINOPHEN 500 MG PO TABS
1000.0000 mg | ORAL_TABLET | Freq: Once | ORAL | Status: AC
  Administered 2016-02-13: 1000 mg via ORAL
  Filled 2016-02-13: qty 2

## 2016-02-13 MED ORDER — DICLOFENAC SODIUM 1 % TD GEL: 4.0000 g | Freq: Four times a day (QID) | TRANSDERMAL | 0 refills | Status: AC | PRN

## 2016-02-13 NOTE — Discharge Instructions (Signed)
You have been seen and diagnosed with back pain today in the Emergency Department.  Medicines: Please take the medicine as prescribed. Call your primary healthcare provider if you think your medicine is not working as expected or if you feel you need more. I do not generally provide refills on pain medications in the ED because I don?t monitor you on a long-term basis. Your primary care doctor does. Do not drive or operate heavy machinery while taking narcotic pain medications.  Self-care: Exercise: Gentle exercise may help decrease your pain. Start with some light exercises such as walking, biking, or swimming during the first 2 weeks. Ask for more information about the activities or exercises that are right for you. Maintain a healthy weight.  Ice: Ice helps decrease swelling, pain, and muscle spasm. Put crushed ice in a plastic bag. Cover it with a towel. Place it on your lower back for 20 to 30 minutes every 2 hours until improvement.  Heat: Heat helps decrease pain and muscle spasms. Use a small towel dampened with warm water or a heat pad, or sit in a warm bath. Apply heat on the area for 20 to 30 minutes every 2 hours until improvement. Alternate heat and ice.  Physical therapy: You may need to see a physical therapist to teach you special exercises. These exercises help improve movement and decrease pain. Physical therapy can also help improve strength and decrease your risk for loss of function. Please follow-up with your PCP about physical therapy.  Contact your primary healthcare provider or orthopedist if:  You have a fever.  You have pain at night or when you rest.  Your pain does not get better with treatment.  You have pain that worsens when you cough, sneeze, or strain your back.  You suddenly feel something pop or snap in your back.  You have questions or concerns about your condition or care.  Please return to the emergency department if:  You have severe pain.  You have  sudden stiffness or heaviness to buttocks down to both legs.  You have numbness or weakness in one leg, or pain in both legs.  You have numbness in your genital area or across your lower back.  You cannot control your urine or bowel movements. Pain

## 2016-02-13 NOTE — ED Provider Notes (Signed)
[facility] Emergency Department Provider Note    First MD Initiated Contact with Patient 02/13/16 1840     (approximate)  I have reviewed the triage vital signs and the nursing notes.   HISTORY  Chief Complaint Fall    HPI Deborah Snyder is a 83 y.o. female with dementia on Xarelto for uncertain reason presents after mechanical fall at nursing facility. Daughter states that the nursing aides were helping the patient's side in her chair to be fed. She Sliding in the Wheelchair and [place] on Her [place]. There Was No Head Injury. She Is Otherwise Acting at Baseline. There Are Bruises to the Posterior Lumbar Back. She Generally Moves with Wheelchair for Assistance.   Past Medical History:  Diagnosis Date  . Atrial fibrillation (HCC)   . Dementia   . Diabetes (HCC)   . Edema   . GERD (gastroesophageal reflux disease)   . Hyperlipidemia   . Hypertension    Family History  Problem Relation Age of Onset  . Diabetes Other    History reviewed. No pertinent surgical history. Patient Active Problem List   Diagnosis Date Noted  . Atrial fibrillation with rapid ventricular response (HCC) 08/26/2015      Prior to Admission medications   Medication Sig Start Date End Date Taking? Authorizing Provider  acetaminophen (TYLENOL) 650 MG CR tablet Take 650 mg by mouth 3 (three) times daily.   Yes Historical Provider, MD  busPIRone (BUSPAR) 5 MG tablet Take 5 mg by mouth daily.   Yes Historical Provider, MD  furosemide (LASIX) 20 MG tablet Take 20 mg by mouth daily.    Yes Historical Provider, MD  HYDROcodone-acetaminophen (NORCO/VICODIN) 5-325 MG per tablet Take 0.5-1 tablets by mouth every 6 (six) hours as needed for moderate pain or severe pain. Patient taking differently: Take 1 tablet by mouth every 6 (six) hours as needed for severe pain.  08/31/14  Yes Lutricia Feil, PA-C  insulin glargine (LANTUS) 100 UNIT/ML injection Inject 20 Units into the  skin at bedtime.    Yes Historical Provider, MD  latanoprost (XALATAN) 0.005 % ophthalmic solution Place 1 drop into both eyes at bedtime.    Yes Historical Provider, MD  lisinopril (PRINIVIL,ZESTRIL) 10 MG tablet Take 10 mg by mouth daily.   Yes Historical Provider, MD  metFORMIN (GLUCOPHAGE) 500 MG tablet Take 500 mg by mouth 2 (two) times daily.    Yes Historical Provider, MD  omega-3 acid ethyl esters (LOVAZA) 1 g capsule Take 1 g by mouth daily.   Yes Historical Provider, MD  potassium chloride (K-DUR) 10 MEQ tablet Take 10 mEq by mouth daily.   Yes Historical Provider, MD  rivaroxaban (XARELTO) 20 MG TABS tablet Take 20 mg by mouth every morning.    Yes Historical Provider, MD  simvastatin (ZOCOR) 40 MG tablet Take 40 mg by mouth at bedtime.    Yes Historical Provider, MD  metoprolol tartrate (LOPRESSOR) 25 MG tablet Take 1 tablet (25 mg total) by mouth 2 (two) times daily. Patient not taking: Reported on 02/13/2016 06/26/15   Katha Hamming, MD  predniSONE (STERAPRED UNI-PAK 21 TAB) 10 MG (21) TBPK tablet Take 1 tablet (10 mg total) by mouth daily. Taper as directed Patient not taking: Reported on 02/13/2016 06/26/15   Katha Hamming, MD    Allergies Sulfa antibiotics    Social History Social History  Substance Use Topics  . Smoking status: Never Smoker  . Smokeless tobacco: Never Used  .  Alcohol use No    Review of Systems Patient denies headaches, rhinorrhea, blurry vision, numbness, shortness of breath, chest pain, edema, cough, abdominal pain, nausea, vomiting, diarrhea, dysuria, fevers, rashes or hallucinations unless otherwise stated above in HPI. ____________________________________________   PHYSICAL EXAM:  VITAL SIGNS: Vitals:   02/13/16 1843  BP: (!) 160/107  Pulse: 99  Resp: 16  Temp: 98.2 F (36.8 C)    Constitutional: Alert  in no acute distress. Eyes: Conjunctivae are normal. PERRL. EOMI. Head: Atraumatic. Nose: No  congestion/rhinnorhea. Mouth/Throat: Mucous membranes are moist.  Oropharynx non-erythematous. Neck: No stridor. Painless ROM. No cervical spine tenderness to palpation Hematological/Lymphatic/Immunilogical: No cervical lymphadenopathy. Cardiovascular: Normal rate, regular rhythm. Grossly normal heart sounds.  Good peripheral circulation. Respiratory: Normal respiratory effort.  No retractions. Lungs CTAB. Gastrointestinal: Soft and nontender. No distention. No abdominal bruits. No CVA tenderness. Genitourinary:  Musculoskeletal: No lower extremity tenderness nor edema. Mild paraspinal ttp and gluteal ttp, no pain with log roll, no sciatica No joint effusions. Neurologic:  Normal speech and language. No gross focal neurologic deficits are appreciated. No gait instability. Skin:  Skin is warm, dry and intact. No rash noted. Psychiatric: Mood and affect are normal. Speech and behavior are normal.  ____________________________________________   LABS (all labs ordered are listed, but only abnormal results are displayed)  No results found for this or any previous visit (from the past 24 hour(s)). ____________________________________________  EKG____________________________________________  RADIOLOGY  I personally reviewed all radiographic images ordered to evaluate for the above acute complaints and reviewed radiology reports and findings.  These findings were personally discussed with the patient.  Please see medical record for radiology report.  ____________________________________________   PROCEDURES  Procedure(s) performed:  Procedures    Critical Care performed: no ____________________________________________   INITIAL IMPRESSION / ASSESSMENT AND PLAN / ED COURSE  Pertinent labs & imaging results that were available during my care of the patient were reviewed by me and considered in my medical decision making (see chart for details).  DDX: fracture, contusion,  abrasoin  Deborah Snyder is a 83 y.o. who presents to the ED with low back pain and gluteal pain after mechanical fall.  Patient is AFVSS in ED. Exam as above. Given current presentation have considered the above differential.   She otherwise he'll dynamically stable. Remainder of primary secondary survey unremarkable. Plain films of lumbar spine and pelvis ordered to evaluate for any osseous fracture. X-rays with evidence of probable chronic L2 superior endplate compression fracture. She has no neuro deficits. Patient otherwise hemodynamically stable and appropriate for discharge back to facility.      ____________________________________________   FINAL CLINICAL IMPRESSION(S) / ED DIAGNOSES  Final diagnoses:  Fall, initial encounter  Chronic midline low back pain without sciatica      NEW MEDICATIONS STARTED DURING THIS VISIT:  New Prescriptions   No medications on file     Note:  This document was prepared using Dragon voice recognition software and may include unintentional dictation errors.    Willy EddyPatrick Sameera Betton, MD 02/13/16 2018

## 2016-02-13 NOTE — ED Triage Notes (Signed)
Pt to ED via EMS from [facility], pt had possible unwitnessed fall. Pt found by staff sitting on floor at baseline cognition. Pt has hx of dementia. VS stable. No injuries noted, pt c/o lower back pain, per EMS chronic

## 2016-02-25 ENCOUNTER — Observation Stay
Admission: EM | Admit: 2016-02-25 | Discharge: 2016-02-27 | Disposition: A | Discharge status: Skilled Nursing Facility | Payer: Medicare Other | Attending: Internal Medicine | Admitting: Internal Medicine

## 2016-02-25 ENCOUNTER — Observation Stay: Payer: Medicare Other

## 2016-02-25 ENCOUNTER — Emergency Department: Payer: Medicare Other

## 2016-02-25 ENCOUNTER — Encounter: Payer: Self-pay | Admitting: *Deleted

## 2016-02-25 DIAGNOSIS — N281 Cyst of kidney, acquired: Secondary | ICD-10-CM | POA: Insufficient documentation

## 2016-02-25 DIAGNOSIS — Z882 Allergy status to sulfonamides status: Secondary | ICD-10-CM | POA: Insufficient documentation

## 2016-02-25 DIAGNOSIS — I4891 Unspecified atrial fibrillation: Secondary | ICD-10-CM | POA: Diagnosis not present

## 2016-02-25 DIAGNOSIS — K5730 Diverticulosis of large intestine without perforation or abscess without bleeding: Secondary | ICD-10-CM | POA: Insufficient documentation

## 2016-02-25 DIAGNOSIS — M549 Dorsalgia, unspecified: Secondary | ICD-10-CM | POA: Diagnosis present

## 2016-02-25 DIAGNOSIS — B9689 Other specified bacterial agents as the cause of diseases classified elsewhere: Secondary | ICD-10-CM | POA: Insufficient documentation

## 2016-02-25 DIAGNOSIS — I700 Atherosclerosis of aorta: Secondary | ICD-10-CM | POA: Insufficient documentation

## 2016-02-25 DIAGNOSIS — Z7901 Long term (current) use of anticoagulants: Secondary | ICD-10-CM | POA: Insufficient documentation

## 2016-02-25 DIAGNOSIS — Z1611 Resistance to penicillins: Secondary | ICD-10-CM | POA: Diagnosis not present

## 2016-02-25 DIAGNOSIS — F0391 Unspecified dementia with behavioral disturbance: Secondary | ICD-10-CM | POA: Insufficient documentation

## 2016-02-25 DIAGNOSIS — K429 Umbilical hernia without obstruction or gangrene: Secondary | ICD-10-CM | POA: Insufficient documentation

## 2016-02-25 DIAGNOSIS — N390 Urinary tract infection, site not specified: Secondary | ICD-10-CM | POA: Insufficient documentation

## 2016-02-25 DIAGNOSIS — M5126 Other intervertebral disc displacement, lumbar region: Secondary | ICD-10-CM | POA: Insufficient documentation

## 2016-02-25 DIAGNOSIS — R609 Edema, unspecified: Secondary | ICD-10-CM | POA: Diagnosis not present

## 2016-02-25 DIAGNOSIS — E119 Type 2 diabetes mellitus without complications: Secondary | ICD-10-CM | POA: Diagnosis not present

## 2016-02-25 DIAGNOSIS — Y939 Activity, unspecified: Secondary | ICD-10-CM | POA: Diagnosis not present

## 2016-02-25 DIAGNOSIS — I119 Hypertensive heart disease without heart failure: Secondary | ICD-10-CM | POA: Insufficient documentation

## 2016-02-25 DIAGNOSIS — Z90710 Acquired absence of both cervix and uterus: Secondary | ICD-10-CM | POA: Insufficient documentation

## 2016-02-25 DIAGNOSIS — R262 Difficulty in walking, not elsewhere classified: Secondary | ICD-10-CM

## 2016-02-25 DIAGNOSIS — M419 Scoliosis, unspecified: Secondary | ICD-10-CM | POA: Insufficient documentation

## 2016-02-25 DIAGNOSIS — M5137 Other intervertebral disc degeneration, lumbosacral region: Secondary | ICD-10-CM | POA: Insufficient documentation

## 2016-02-25 DIAGNOSIS — Z9049 Acquired absence of other specified parts of digestive tract: Secondary | ICD-10-CM | POA: Insufficient documentation

## 2016-02-25 DIAGNOSIS — S32020G Wedge compression fracture of second lumbar vertebra, subsequent encounter for fracture with delayed healing: Secondary | ICD-10-CM

## 2016-02-25 DIAGNOSIS — Z794 Long term (current) use of insulin: Secondary | ICD-10-CM | POA: Insufficient documentation

## 2016-02-25 DIAGNOSIS — M545 Low back pain: Secondary | ICD-10-CM

## 2016-02-25 DIAGNOSIS — Z79899 Other long term (current) drug therapy: Secondary | ICD-10-CM | POA: Insufficient documentation

## 2016-02-25 DIAGNOSIS — K449 Diaphragmatic hernia without obstruction or gangrene: Secondary | ICD-10-CM | POA: Insufficient documentation

## 2016-02-25 DIAGNOSIS — Z66 Do not resuscitate: Secondary | ICD-10-CM

## 2016-02-25 DIAGNOSIS — E785 Hyperlipidemia, unspecified: Secondary | ICD-10-CM | POA: Insufficient documentation

## 2016-02-25 DIAGNOSIS — Z833 Family history of diabetes mellitus: Secondary | ICD-10-CM | POA: Insufficient documentation

## 2016-02-25 DIAGNOSIS — Z1639 Resistance to other specified antimicrobial drug: Secondary | ICD-10-CM | POA: Diagnosis not present

## 2016-02-25 DIAGNOSIS — I10 Essential (primary) hypertension: Secondary | ICD-10-CM | POA: Insufficient documentation

## 2016-02-25 DIAGNOSIS — S32020A Wedge compression fracture of second lumbar vertebra, initial encounter for closed fracture: Secondary | ICD-10-CM | POA: Diagnosis not present

## 2016-02-25 DIAGNOSIS — Z993 Dependence on wheelchair: Secondary | ICD-10-CM | POA: Insufficient documentation

## 2016-02-25 DIAGNOSIS — K219 Gastro-esophageal reflux disease without esophagitis: Secondary | ICD-10-CM | POA: Insufficient documentation

## 2016-02-25 DIAGNOSIS — Z1623 Resistance to quinolones and fluoroquinolones: Secondary | ICD-10-CM | POA: Insufficient documentation

## 2016-02-25 DIAGNOSIS — Z7189 Other specified counseling: Secondary | ICD-10-CM

## 2016-02-25 DIAGNOSIS — T148XXA Other injury of unspecified body region, initial encounter: Secondary | ICD-10-CM

## 2016-02-25 DIAGNOSIS — Z515 Encounter for palliative care: Secondary | ICD-10-CM | POA: Insufficient documentation

## 2016-02-25 DIAGNOSIS — W19XXXA Unspecified fall, initial encounter: Secondary | ICD-10-CM | POA: Diagnosis not present

## 2016-02-25 DIAGNOSIS — Z1629 Resistance to other single specified antibiotic: Secondary | ICD-10-CM | POA: Diagnosis not present

## 2016-02-25 DIAGNOSIS — R109 Unspecified abdominal pain: Secondary | ICD-10-CM | POA: Diagnosis present

## 2016-02-25 DIAGNOSIS — I2510 Atherosclerotic heart disease of native coronary artery without angina pectoris: Secondary | ICD-10-CM | POA: Diagnosis not present

## 2016-02-25 DIAGNOSIS — F03918 Unspecified dementia, unspecified severity, with other behavioral disturbance: Secondary | ICD-10-CM

## 2016-02-25 DIAGNOSIS — I517 Cardiomegaly: Secondary | ICD-10-CM | POA: Diagnosis not present

## 2016-02-25 DIAGNOSIS — R1084 Generalized abdominal pain: Secondary | ICD-10-CM

## 2016-02-25 LAB — URINALYSIS, COMPLETE (UACMP) WITH MICROSCOPIC
BILIRUBIN URINE: NEGATIVE
GLUCOSE, UA: NEGATIVE mg/dL
HGB URINE DIPSTICK: NEGATIVE
Ketones, ur: NEGATIVE mg/dL
LEUKOCYTES UA: NEGATIVE
NITRITE: POSITIVE — AB
PROTEIN: NEGATIVE mg/dL
RBC / HPF: NONE SEEN RBC/hpf (ref 0–5)
Specific Gravity, Urine: 1.014 (ref 1.005–1.030)
pH: 5 (ref 5.0–8.0)

## 2016-02-25 LAB — BASIC METABOLIC PANEL
ANION GAP: 10 (ref 5–15)
BUN: 37 mg/dL — ABNORMAL HIGH (ref 6–20)
CHLORIDE: 107 mmol/L (ref 101–111)
CO2: 26 mmol/L (ref 22–32)
Calcium: 9 mg/dL (ref 8.9–10.3)
Creatinine, Ser: 1.14 mg/dL — ABNORMAL HIGH (ref 0.44–1.00)
GFR calc non Af Amer: 41 mL/min — ABNORMAL LOW (ref >60)
GFR, EST AFRICAN AMERICAN: 47 mL/min — AB (ref >60)
Glucose, Bld: 141 mg/dL — ABNORMAL HIGH (ref 65–99)
POTASSIUM: 4 mmol/L (ref 3.5–5.1)
Sodium: 143 mmol/L (ref 135–145)

## 2016-02-25 LAB — CBC
HEMATOCRIT: 36.7 % (ref 35.0–47.0)
HEMOGLOBIN: 12.4 g/dL (ref 12.0–16.0)
MCH: 29.8 pg (ref 26.0–34.0)
MCHC: 33.8 g/dL (ref 32.0–36.0)
MCV: 88.2 fL (ref 80.0–100.0)
Platelets: 266 10*3/uL (ref 150–440)
RBC: 4.16 MIL/uL (ref 3.80–5.20)
RDW: 15.2 % — ABNORMAL HIGH (ref 11.5–14.5)
WBC: 14.3 10*3/uL — AB (ref 3.6–11.0)

## 2016-02-25 MED ORDER — QUETIAPINE FUMARATE 25 MG PO TABS
25.0000 mg | ORAL_TABLET | Freq: Every day | ORAL | Status: DC
  Administered 2016-02-25 – 2016-02-26 (×2): 25 mg via ORAL
  Filled 2016-02-25 (×2): qty 1

## 2016-02-25 MED ORDER — DEXTROSE 5 % IV SOLN: 1.0000 g | Freq: Once | INTRAVENOUS | Status: DC

## 2016-02-25 MED ORDER — CEFTRIAXONE SODIUM-DEXTROSE 1-3.74 GM-% IV SOLR
1.0000 g | Freq: Once | INTRAVENOUS | Status: AC
  Administered 2016-02-25: 1 g via INTRAVENOUS
  Filled 2016-02-25: qty 50

## 2016-02-25 MED ORDER — MORPHINE SULFATE (PF) 4 MG/ML IV SOLN
4.0000 mg | Freq: Once | INTRAVENOUS | Status: AC
  Administered 2016-02-25: 4 mg via INTRAVENOUS
  Filled 2016-02-25: qty 1

## 2016-02-25 MED ORDER — DICLOFENAC SODIUM 1 % TD GEL
4.0000 g | Freq: Four times a day (QID) | TRANSDERMAL | Status: DC | PRN
  Filled 2016-02-25: qty 100

## 2016-02-25 MED ORDER — LISINOPRIL 10 MG PO TABS
10.0000 mg | ORAL_TABLET | Freq: Every day | ORAL | Status: DC
  Administered 2016-02-26 – 2016-02-27 (×2): 10 mg via ORAL
  Filled 2016-02-25 (×2): qty 1

## 2016-02-25 MED ORDER — RIVAROXABAN 20 MG PO TABS
20.0000 mg | ORAL_TABLET | Freq: Every day | ORAL | Status: DC
  Administered 2016-02-26: 20 mg via ORAL
  Filled 2016-02-25: qty 1

## 2016-02-25 MED ORDER — TRAMADOL HCL 50 MG PO TABS
50.0000 mg | ORAL_TABLET | Freq: Four times a day (QID) | ORAL | Status: DC | PRN
  Administered 2016-02-26: 50 mg via ORAL
  Filled 2016-02-25: qty 1

## 2016-02-25 MED ORDER — LORAZEPAM 2 MG/ML IJ SOLN
0.5000 mg | Freq: Once | INTRAMUSCULAR | Status: AC
  Administered 2016-02-25: 0.5 mg via INTRAVENOUS

## 2016-02-25 MED ORDER — LORAZEPAM 2 MG/ML IJ SOLN: 1.0000 mg | Freq: Once | INTRAMUSCULAR | Status: DC

## 2016-02-25 MED ORDER — MORPHINE SULFATE (PF) 2 MG/ML IV SOLN
INTRAVENOUS | Status: AC
  Administered 2016-02-25: 1 mg via INTRAVENOUS
  Filled 2016-02-25: qty 1

## 2016-02-25 MED ORDER — MORPHINE SULFATE (PF) 2 MG/ML IV SOLN
1.0000 mg | INTRAVENOUS | Status: DC | PRN
  Administered 2016-02-25 – 2016-02-27 (×5): 1 mg via INTRAVENOUS
  Filled 2016-02-25 (×4): qty 1

## 2016-02-25 MED ORDER — BUSPIRONE HCL 5 MG PO TABS
5.0000 mg | ORAL_TABLET | Freq: Every day | ORAL | Status: DC
  Administered 2016-02-26 – 2016-02-27 (×2): 5 mg via ORAL
  Filled 2016-02-25 (×2): qty 1

## 2016-02-25 MED ORDER — LATANOPROST 0.005 % OP SOLN
1.0000 [drp] | Freq: Every day | OPHTHALMIC | Status: DC
  Administered 2016-02-25 – 2016-02-26 (×2): 1 [drp] via OPHTHALMIC
  Filled 2016-02-25: qty 2.5

## 2016-02-25 MED ORDER — ACETAMINOPHEN 650 MG RE SUPP: 650.0000 mg | Freq: Four times a day (QID) | RECTAL | Status: DC | PRN

## 2016-02-25 MED ORDER — SODIUM CHLORIDE 0.9 % IV SOLN
INTRAVENOUS | Status: DC
  Administered 2016-02-25: 23:00:00 via INTRAVENOUS

## 2016-02-25 MED ORDER — SIMVASTATIN 20 MG PO TABS
40.0000 mg | ORAL_TABLET | Freq: Every day | ORAL | Status: DC
  Administered 2016-02-25: 40 mg via ORAL
  Filled 2016-02-25: qty 1
  Filled 2016-02-25: qty 2

## 2016-02-25 MED ORDER — LORAZEPAM 2 MG/ML IJ SOLN
INTRAMUSCULAR | Status: AC
  Administered 2016-02-25: 0.5 mg via INTRAVENOUS
  Filled 2016-02-25: qty 1

## 2016-02-25 NOTE — Consult Note (Signed)
Surgical Consultation  02/25/2016  Deborah Snyder is an 83 y.o. female.   CC: Recent fall  HPI: This is a demented patient who expressed recent fall and is being admitted the hospital by the hospitalist group. The emergency room physician spoke directly to me concerning this patient's new finding of abdominal pain while she was in the ER the emergency room physician did not feel that she had an acute abdomen but I was asked see the patient to confirm her normal studies and exam.  No history can be obtained from the patient. She is demented. There is no family member present. History is obtained from the chart and from her treating physicians.  No review of systems is possible.  Past Medical History:  Diagnosis Date  . Atrial fibrillation ([place])   . Dementia   . Diabetes ([place])   . Edema   . GERD (gastroesophageal reflux disease)   . Hyperlipidemia   . Hypertension     Past Surgical History:  Procedure Laterality Date  . NO PAST SURGERIES      Family History  Problem Relation Age of Onset  . Diabetes Other     Social History:  reports that she has never smoked. She has never used smokeless tobacco. She reports that she does not drink alcohol or use drugs.  Allergies:  Allergies  Allergen Reactions  . Sulfa Antibiotics     Medications reviewed.   Review of Systems:   Review of Systems  Unable to perform ROS: Dementia     Physical Exam:  BP 110/66 (BP Location: Right Arm)   Pulse (!) 56   Temp 98 F (36.7 C) (Oral)   Resp 16   Wt 196 lb (88.9 kg)   SpO2 100%   BMI 30.70 kg/m   Physical Exam  Constitutional: She is well-developed, well-nourished, and in no distress.  Appears uncomfortable lying on her right side. When she is to attached on any spot on her body essentially she quit over his shakes and cries out. This includes her abdomen and her extremities.  HENT:  Head: Normocephalic and atraumatic.  Eyes: Pupils are equal, round, and reactive to light.  Right eye exhibits no discharge. Left eye exhibits no discharge. No scleral icterus.  Abdominal: She exhibits distension. There is tenderness. There is guarding. There is no rebound.  Examination is performed while trying to distract the patient it is difficult to determine tenderness in this patient as she shows some guarding but it is not involuntary and in fact when she is slightly distracted regarding improves and when you touch any other portion of her body she has a similar reaction that of withdrawal and crying out Midline scar noted.  Skin: Skin is warm and dry. No rash noted. No erythema.  Vitals reviewed.     Results for orders placed or performed during the hospital encounter of 02/25/16 (from the past 48 hour(s))  CBC     Status: Abnormal   Collection Time: 02/25/16  2:37 PM  Result Value Ref Range   WBC 14.3 (H) 3.6 - 11.0 K/uL   RBC 4.16 3.80 - 5.20 MIL/uL   Hemoglobin 12.4 12.0 - 16.0 g/dL   HCT 36.7 35.0 - 47.0 %   MCV 88.2 80.0 - 100.0 fL   MCH 29.8 26.0 - 34.0 pg   MCHC 33.8 32.0 - 36.0 g/dL   RDW 15.2 (H) 11.5 - 14.5 %   Platelets 266 150 - 440 K/uL  Basic metabolic panel  Status: Abnormal   Collection Time: 02/25/16  2:37 PM  Result Value Ref Range   Sodium 143 135 - 145 mmol/L   Potassium 4.0 3.5 - 5.1 mmol/L   Chloride 107 101 - 111 mmol/L   CO2 26 22 - 32 mmol/L   Glucose, Bld 141 (H) 65 - 99 mg/dL   BUN 37 (H) 6 - 20 mg/dL   Creatinine, Ser 1.14 (H) 0.44 - 1.00 mg/dL   Calcium 9.0 8.9 - 10.3 mg/dL   GFR calc non Af Amer 41 (L) >60 mL/min   GFR calc Af Amer 47 (L) >60 mL/min    Comment: (NOTE) The eGFR has been calculated using the CKD EPI equation. This calculation has not been validated in all clinical situations. eGFR's persistently <60 mL/min signify possible Chronic Kidney Disease.    Anion gap 10 5 - 15  Urinalysis, Complete w Microscopic     Status: Abnormal   Collection Time: 02/25/16  2:37 PM  Result Value Ref Range   Color, Urine  YELLOW (A) YELLOW   APPearance CLEAR (A) CLEAR   Specific Gravity, Urine 1.014 1.005 - 1.030   pH 5.0 5.0 - 8.0   Glucose, UA NEGATIVE NEGATIVE mg/dL   Hgb urine dipstick NEGATIVE NEGATIVE   Bilirubin Urine NEGATIVE NEGATIVE   Ketones, ur NEGATIVE NEGATIVE mg/dL   Protein, ur NEGATIVE NEGATIVE mg/dL   Nitrite POSITIVE (A) NEGATIVE   Leukocytes, UA NEGATIVE NEGATIVE   RBC / HPF NONE SEEN 0 - 5 RBC/hpf   WBC, UA 0-5 0 - 5 WBC/hpf   Bacteria, UA RARE (A) NONE SEEN   Squamous Epithelial / LPF 0-5 (A) NONE SEEN   Mucous PRESENT    Hyaline Casts, UA PRESENT    Ct Abdomen Pelvis Wo Contrast  Result Date: 02/25/2016 CLINICAL DATA:  Compression fracture of lumbar spine. Addendum distention with acute tenderness and guarding. EXAM: CT ABDOMEN AND PELVIS WITHOUT CONTRAST TECHNIQUE: Multidetector CT imaging of the abdomen and pelvis was performed following the standard protocol without IV contrast. COMPARISON:  Same day thoracolumbar spine CT FINDINGS: Lower chest: Cardiomegaly with coronary arteriosclerosis. No pericardial effusion. Mild subpleural areas of atelectasis and/or fibrosis. Faint ground-glass opacities may be secondary to hypoventilatory change at each lung base. Hepatobiliary: The unenhanced liver is unremarkable. No biliary dilatation. The patient is status post cholecystectomy. Pancreas: No pancreatic mass or ductal dilatation. Mild fatty atrophy. Spleen: No splenomegaly or mass. Adrenals/Urinary Tract: Mild thickening of left adrenal apex. No adrenal nodule or mass. No nephrolithiasis nor obstructive uropathy. There appear to be interpolar cysts off the right kidney measuring 1.5 cm anteriorly and 0.8 cm posteriorly. Stomach/Bowel: The stomach is contracted. There is normal small bowel rotation. No small bowel dilatation. There is extensive diverticulosis distal transverse colon sigmoid. No acute inflammation. No large bowel dilatation. Numerous sutures noted in the right hemiabdomen.  Appendix not visualized. The appendix may be surgically absent. Vascular/Lymphatic: Aortic and branch vessel atherosclerosis. Bilateral renal artery stents at the origins. No aneurysm no adenopathy by CT criteria. Reproductive: Hysterectomy.  No adnexal mass. Other: Small fat containing umbilical hernia. No free air nor free fluid. Musculoskeletal: 2 superior endplate compression fracture with 50% height loss and slight retropulsion of posterosuperior aspect. Degenerative disc space narrowing L5-S1 with multilevel facet arthropathy. IMPRESSION: 1. Compression fracture of L2 with slight retropulsion but without significant canal stenosis. 2. No acute intra-abdominal or pelvic solid nor hollow visceral organ abnormality. 3. Extensive colonic diverticulosis without acute diverticulitis. No bowel obstruction or perforation.  4. Aortic and branch vessel atherosclerosis. Bilateral renal artery stents. 5. Right-sided interpolar renal cysts the largest 1.5 cm and simple in appearance. 6. Cholecystectomy. 7. Appendix not visualized and may be surgically absent. No pericecal inflammation. 8. Cardiomegaly with coronary arteriosclerosis. Electronically Signed   By: Ashley Royalty M.D.   On: 02/25/2016 18:37   Ct Thoracic Spine Wo Contrast  Result Date: 02/25/2016 CLINICAL DATA:  83 year old female post fall with compression fracture. Subsequent encounter. EXAM: CT THORACIC AND LUMBAR SPINE WITHOUT CONTRAST TECHNIQUE: Multidetector CT imaging of the thoracic and lumbar spine was performed without contrast. Multiplanar CT image reconstructions were also generated. COMPARISON:  02/13/2016 plain film exam. 02/28/2012 CT abdomen and pelvis. 08/02/1998 and chest CT. FINDINGS: CT THORACIC SPINE FINDINGS Alignment: Mild kyphosis. Vertebrae: No thoracic spine compression fracture. Paraspinal and other soft tissues: Small hiatal hernia. Mild cardiomegaly. Trace pericardial fluid. Prominent coronary artery calcifications. Atherosclerotic  changes thoracic aorta. Motion degradation without worrisome lung lesion. Disc levels: Scattered minimal degenerative changes without significant spinal stenosis. CT LUMBAR SPINE FINDINGS Segmentation:  Last fully open disc space L5-S1. Alignment: Scoliosis convex left. Vertebrae: L2 superior endplate comminuted compression fracture with 50% loss height. Mild retropulsion posterosuperior aspect contributes to mild ventral thecal sac narrowing without high-grade spinal stenosis. No other fracture noted. Paraspinal and other soft tissues: Stable right renal 1.2 cm cyst. Atherosclerotic changes aorta and common iliac arteries with ectasia. Post renal artery stenting. Disc levels: L1-2:  Minimal bulge. L2-3: Mild bulge. Mild facet degenerative changes and ligamentum flavum hypertrophy. Mild spinal stenosis. L3-4: Disc degeneration with disc space narrowing greater on the right. Mild bulge. Ligamentum flavum hypertrophy. Facet degenerative changes. Mild to slightly moderate spinal stenosis. L4-5: Facet degenerative changes. Bulge. Minimal anterior slip L4. Mild spinal stenosis. Mild right lateral recess narrowing. L5-S1: Mild disc degeneration with minimal bulge and spur. IMPRESSION: CT THORACIC SPINE IMPRESSION No thoracic compression fracture. CT LUMBAR SPINE IMPRESSION L2 superior endplate comminuted compression fracture with 50% loss height. Mild retropulsion posterosuperior aspect contributes to mild ventral thecal sac narrowing without high-grade spinal stenosis. Degenerative changes otherwise as detailed above. Aortic atherosclerosis. Electronically Signed   By: Genia Del M.D.   On: 02/25/2016 16:45   Ct Lumbar Spine Wo Contrast  Result Date: 02/25/2016 CLINICAL DATA:  83 year old female post fall with compression fracture. Subsequent encounter. EXAM: CT THORACIC AND LUMBAR SPINE WITHOUT CONTRAST TECHNIQUE: Multidetector CT imaging of the thoracic and lumbar spine was performed without contrast.  Multiplanar CT image reconstructions were also generated. COMPARISON:  02/13/2016 plain film exam. 02/28/2012 CT abdomen and pelvis. 08/02/1998 and chest CT. FINDINGS: CT THORACIC SPINE FINDINGS Alignment: Mild kyphosis. Vertebrae: No thoracic spine compression fracture. Paraspinal and other soft tissues: Small hiatal hernia. Mild cardiomegaly. Trace pericardial fluid. Prominent coronary artery calcifications. Atherosclerotic changes thoracic aorta. Motion degradation without worrisome lung lesion. Disc levels: Scattered minimal degenerative changes without significant spinal stenosis. CT LUMBAR SPINE FINDINGS Segmentation:  Last fully open disc space L5-S1. Alignment: Scoliosis convex left. Vertebrae: L2 superior endplate comminuted compression fracture with 50% loss height. Mild retropulsion posterosuperior aspect contributes to mild ventral thecal sac narrowing without high-grade spinal stenosis. No other fracture noted. Paraspinal and other soft tissues: Stable right renal 1.2 cm cyst. Atherosclerotic changes aorta and common iliac arteries with ectasia. Post renal artery stenting. Disc levels: L1-2:  Minimal bulge. L2-3: Mild bulge. Mild facet degenerative changes and ligamentum flavum hypertrophy. Mild spinal stenosis. L3-4: Disc degeneration with disc space narrowing greater on the right. Mild bulge. Ligamentum flavum  hypertrophy. Facet degenerative changes. Mild to slightly moderate spinal stenosis. L4-5: Facet degenerative changes. Bulge. Minimal anterior slip L4. Mild spinal stenosis. Mild right lateral recess narrowing. L5-S1: Mild disc degeneration with minimal bulge and spur. IMPRESSION: CT THORACIC SPINE IMPRESSION No thoracic compression fracture. CT LUMBAR SPINE IMPRESSION L2 superior endplate comminuted compression fracture with 50% loss height. Mild retropulsion posterosuperior aspect contributes to mild ventral thecal sac narrowing without high-grade spinal stenosis. Degenerative changes otherwise  as detailed above. Aortic atherosclerosis. Electronically Signed   By: Genia Del M.D.   On: 02/25/2016 16:45    Assessment/Plan:  I was asked to consult on this patient to confirm that her normal studies and physical exam by the emergency room physician did not include any sign of an acute abdomen. The emergency room physician did not feel that there was an acute abdomen and she has all normal studies which I have reviewed.  While her exam is difficult because of her dementia and her crying out it is clear that even though she and initially has signs of tenderness and distention on her abdominal exam her withdrawal and crying out occurs whenever you touch her on her extremities shoulder etc. At this point I see no obvious signs of an acute abdomen and her labs and vital signs do not suggest that either. I discussed this patient with the prime doc admitting physician as well as the emergency room physician and will be happy to follow the patient with serial exams.  Florene Glen, MD, FACS

## 2016-02-25 NOTE — ED Notes (Signed)
Patient transported to CT 

## 2016-02-25 NOTE — Progress Notes (Signed)
Patient ID: Deborah Snyder, female   DOB: Mar 31, 1922, 83 y.o.   MRN: [id number]  Called to observe patient for compression fracture lumbar spine but patient abdomen was distended and acutely tender with gaurding and rebound.  I asked Dr Don PerkingVeronese ER physician to re-evaluate the patient.  She ordered a CT scan of the abdomen and pelvis to rule out surgical abdomen.  I canceled the medical observation at this point.  Need to wait to see if ct scan shows something surgical.  Dr Alford Highlandichard Urvi Imes, MD

## 2016-02-25 NOTE — ED Notes (Addendum)
Pt held in ED per MD request while waiting for CT abd results.

## 2016-02-25 NOTE — H&P (Signed)
[facility] [place] [facility]   PATIENT NAME: Deborah Snyder    MR#:  [id number]  DATE OF BIRTH:  April 15, 1922  DATE OF ADMISSION:  02/25/2016  PRIMARY CARE PHYSICIAN: No PCP Per Patient   REQUESTING/REFERRING PHYSICIAN: Dr Cecil Cobbs  CHIEF COMPLAINT:   Chief Complaint  Patient presents with  . Back Pain    HISTORY OF PRESENT ILLNESS:  Deborah Snyder  is a 83 y.o. female with a known history of Dementia. She lives in [facility]. Patient unable to give me any history at the time of my evaluation. History obtained from old chart and daughter on the phone. Patient is been having some worsening back pain. Unable to get MRI of her back in the ER. A CT scan of the lumbar spine showed a compression fracture of L2 with 50% loss of height.  When I saw her earlier she was having severe abdominal pain to palpation which she is still having. CAT scan was negative for acute event. Case discussed with Dr. Excell Seltzer surgery who did not believe anything surgical was going on in the abdomen at this point.  PAST MEDICAL HISTORY:   Past Medical History:  Diagnosis Date  . Atrial fibrillation (HCC)   . Dementia   . Diabetes (HCC)   . Edema   . GERD (gastroesophageal reflux disease)   . Hyperlipidemia   . Hypertension     PAST SURGICAL HISTORY:   Past Surgical History:  Procedure Laterality Date  . NO PAST SURGERIES      SOCIAL HISTORY:   Social History  Substance Use Topics  . Smoking status: Never Smoker  . Smokeless tobacco: Never Used  . Alcohol use No    FAMILY HISTORY:   Family History  Problem Relation Age of Onset  . Diabetes Other     DRUG ALLERGIES:   Allergies  Allergen Reactions  . Sulfa Antibiotics     REVIEW OF SYSTEMS:  Unable to obtain review of systems secondary to dementia  MEDICATIONS AT HOME:   Prior to Admission medications   Medication Sig Start Date End Date Taking? Authorizing Provider   acetaminophen (TYLENOL) 650 MG CR tablet Take 650 mg by mouth 3 (three) times daily.   Yes Historical Provider, MD  acetaminophen (TYLENOL) 650 MG suppository Place 650 mg rectally every 6 (six) hours as needed.   Yes Historical Provider, MD  busPIRone (BUSPAR) 5 MG tablet Take 5 mg by mouth daily.   Yes Historical Provider, MD  diclofenac sodium (VOLTAREN) 1 % GEL Apply 4 g topically 4 (four) times daily as needed. Apply to low back and affected area 02/13/16  Yes Willy Eddy, MD  furosemide (LASIX) 20 MG tablet Take 20 mg by mouth daily.    Yes Historical Provider, MD  insulin glargine (LANTUS) 100 UNIT/ML injection Inject 20 Units into the skin daily.    Yes Historical Provider, MD  latanoprost (XALATAN) 0.005 % ophthalmic solution Place 1 drop into both eyes at bedtime.    Yes Historical Provider, MD  lisinopril (PRINIVIL,ZESTRIL) 10 MG tablet Take 10 mg by mouth daily.   Yes Historical Provider, MD  metFORMIN (GLUCOPHAGE) 500 MG tablet Take 500 mg by mouth 2 (two) times daily.    Yes Historical Provider, MD  omega-3 acid ethyl esters (LOVAZA) 1 g capsule Take 1 g by mouth daily.   Yes Historical Provider, MD  potassium chloride (K-DUR) 10 MEQ tablet Take 10 mEq by mouth daily.  Yes Historical Provider, MD  rivaroxaban (XARELTO) 20 MG TABS tablet Take 20 mg by mouth every morning.    Yes Historical Provider, MD  simvastatin (ZOCOR) 40 MG tablet Take 40 mg by mouth at bedtime.    Yes Historical Provider, MD  traMADol (ULTRAM) 50 MG tablet Take by mouth every 6 (six) hours as needed.   Yes Historical Provider, MD  HYDROcodone-acetaminophen (NORCO/VICODIN) 5-325 MG per tablet Take 0.5-1 tablets by mouth every 6 (six) hours as needed for moderate pain or severe pain. Patient taking differently: Take 1 tablet by mouth every 8 (eight) hours as needed for severe pain.  08/31/14   Lutricia FeilWilliam P Roemer, PA-C      VITAL SIGNS:  Blood pressure 110/66, pulse (!) 56, temperature 98 F (36.7 C),  temperature source Oral, resp. rate 16, weight 88.9 kg (196 lb), SpO2 100 %.  PHYSICAL EXAMINATION:  GENERAL:  83 y.o.-year-old patient lying in the bed with acute distress of pain.  EYES: Pupils equal, round, reactive to light and accommodation. No scleral icterus.  HEENT: Head atraumatic, normocephalic. NECK:  Supple, no jugular venous distention. No thyroid enlargement, no tenderness.  LUNGS: Normal breath sounds bilaterally, no wheezing, rales,rhonchi or crepitation. No use of accessory muscles of respiration.  CARDIOVASCULAR: S1, S2 normal. No murmurs, rubs, or gallops.  ABDOMEN: Soft, tenderness with gaurding throughout abdomen, distended. Bowel sounds present. No organomegaly or mass.  EXTREMITIES: No pedal edema, cyanosis, or clubbing.  NEUROLOGIC: Patient moves her extremities on her own PSYCHIATRIC: The patient is alert. SKIN: No rash, lesion, or ulcer.   LABORATORY PANEL:   CBC  Recent Labs Lab 02/25/16 1437  WBC 14.3*  HGB 12.4  HCT 36.7  PLT 266   ------------------------------------------------------------------------------------------------------------------  Chemistries   Recent Labs Lab 02/25/16 1437  NA 143  K 4.0  CL 107  CO2 26  GLUCOSE 141*  BUN 37*  CREATININE 1.14*  CALCIUM 9.0   ------------------------------------------------------------------------------------------------------------------    RADIOLOGY:  Ct Abdomen Pelvis Wo Contrast  Result Date: 02/25/2016 CLINICAL DATA:  Compression fracture of lumbar spine. Addendum distention with acute tenderness and guarding. EXAM: CT ABDOMEN AND PELVIS WITHOUT CONTRAST TECHNIQUE: Multidetector CT imaging of the abdomen and pelvis was performed following the standard protocol without IV contrast. COMPARISON:  Same day thoracolumbar spine CT FINDINGS: Lower chest: Cardiomegaly with coronary arteriosclerosis. No pericardial effusion. Mild subpleural areas of atelectasis and/or fibrosis. Faint  ground-glass opacities may be secondary to hypoventilatory change at each lung base. Hepatobiliary: The unenhanced liver is unremarkable. No biliary dilatation. The patient is status post cholecystectomy. Pancreas: No pancreatic mass or ductal dilatation. Mild fatty atrophy. Spleen: No splenomegaly or mass. Adrenals/Urinary Tract: Mild thickening of left adrenal apex. No adrenal nodule or mass. No nephrolithiasis nor obstructive uropathy. There appear to be interpolar cysts off the right kidney measuring 1.5 cm anteriorly and 0.8 cm posteriorly. Stomach/Bowel: The stomach is contracted. There is normal small bowel rotation. No small bowel dilatation. There is extensive diverticulosis distal transverse colon sigmoid. No acute inflammation. No large bowel dilatation. Numerous sutures noted in the right hemiabdomen. Appendix not visualized. The appendix may be surgically absent. Vascular/Lymphatic: Aortic and branch vessel atherosclerosis. Bilateral renal artery stents at the origins. No aneurysm no adenopathy by CT criteria. Reproductive: Hysterectomy.  No adnexal mass. Other: Small fat containing umbilical hernia. No free air nor free fluid. Musculoskeletal: 2 superior endplate compression fracture with 50% height loss and slight retropulsion of posterosuperior aspect. Degenerative disc space narrowing L5-S1 with multilevel facet arthropathy.  IMPRESSION: 1. Compression fracture of L2 with slight retropulsion but without significant canal stenosis. 2. No acute intra-abdominal or pelvic solid nor hollow visceral organ abnormality. 3. Extensive colonic diverticulosis without acute diverticulitis. No bowel obstruction or perforation. 4. Aortic and branch vessel atherosclerosis. Bilateral renal artery stents. 5. Right-sided interpolar renal cysts the largest 1.5 cm and simple in appearance. 6. Cholecystectomy. 7. Appendix not visualized and may be surgically absent. No pericecal inflammation. 8. Cardiomegaly with coronary  arteriosclerosis. Electronically Signed   By: Tollie Eth M.D.   On: 02/25/2016 18:37   Ct Thoracic Spine Wo Contrast  Result Date: 02/25/2016 CLINICAL DATA:  83 year old female post fall with compression fracture. Subsequent encounter. EXAM: CT THORACIC AND LUMBAR SPINE WITHOUT CONTRAST TECHNIQUE: Multidetector CT imaging of the thoracic and lumbar spine was performed without contrast. Multiplanar CT image reconstructions were also generated. COMPARISON:  02/13/2016 plain film exam. 02/28/2012 CT abdomen and pelvis. 08/02/1998 and chest CT. FINDINGS: CT THORACIC SPINE FINDINGS Alignment: Mild kyphosis. Vertebrae: No thoracic spine compression fracture. Paraspinal and other soft tissues: Small hiatal hernia. Mild cardiomegaly. Trace pericardial fluid. Prominent coronary artery calcifications. Atherosclerotic changes thoracic aorta. Motion degradation without worrisome lung lesion. Disc levels: Scattered minimal degenerative changes without significant spinal stenosis. CT LUMBAR SPINE FINDINGS Segmentation:  Last fully open disc space L5-S1. Alignment: Scoliosis convex left. Vertebrae: L2 superior endplate comminuted compression fracture with 50% loss height. Mild retropulsion posterosuperior aspect contributes to mild ventral thecal sac narrowing without high-grade spinal stenosis. No other fracture noted. Paraspinal and other soft tissues: Stable right renal 1.2 cm cyst. Atherosclerotic changes aorta and common iliac arteries with ectasia. Post renal artery stenting. Disc levels: L1-2:  Minimal bulge. L2-3: Mild bulge. Mild facet degenerative changes and ligamentum flavum hypertrophy. Mild spinal stenosis. L3-4: Disc degeneration with disc space narrowing greater on the right. Mild bulge. Ligamentum flavum hypertrophy. Facet degenerative changes. Mild to slightly moderate spinal stenosis. L4-5: Facet degenerative changes. Bulge. Minimal anterior slip L4. Mild spinal stenosis. Mild right lateral recess  narrowing. L5-S1: Mild disc degeneration with minimal bulge and spur. IMPRESSION: CT THORACIC SPINE IMPRESSION No thoracic compression fracture. CT LUMBAR SPINE IMPRESSION L2 superior endplate comminuted compression fracture with 50% loss height. Mild retropulsion posterosuperior aspect contributes to mild ventral thecal sac narrowing without high-grade spinal stenosis. Degenerative changes otherwise as detailed above. Aortic atherosclerosis. Electronically Signed   By: Lacy Duverney M.D.   On: 02/25/2016 16:45   Ct Lumbar Spine Wo Contrast  Result Date: 02/25/2016 CLINICAL DATA:  83 year old female post fall with compression fracture. Subsequent encounter. EXAM: CT THORACIC AND LUMBAR SPINE WITHOUT CONTRAST TECHNIQUE: Multidetector CT imaging of the thoracic and lumbar spine was performed without contrast. Multiplanar CT image reconstructions were also generated. COMPARISON:  02/13/2016 plain film exam. 02/28/2012 CT abdomen and pelvis. 08/02/1998 and chest CT. FINDINGS: CT THORACIC SPINE FINDINGS Alignment: Mild kyphosis. Vertebrae: No thoracic spine compression fracture. Paraspinal and other soft tissues: Small hiatal hernia. Mild cardiomegaly. Trace pericardial fluid. Prominent coronary artery calcifications. Atherosclerotic changes thoracic aorta. Motion degradation without worrisome lung lesion. Disc levels: Scattered minimal degenerative changes without significant spinal stenosis. CT LUMBAR SPINE FINDINGS Segmentation:  Last fully open disc space L5-S1. Alignment: Scoliosis convex left. Vertebrae: L2 superior endplate comminuted compression fracture with 50% loss height. Mild retropulsion posterosuperior aspect contributes to mild ventral thecal sac narrowing without high-grade spinal stenosis. No other fracture noted. Paraspinal and other soft tissues: Stable right renal 1.2 cm cyst. Atherosclerotic changes aorta and common iliac arteries with ectasia. Post  renal artery stenting. Disc levels: L1-2:   Minimal bulge. L2-3: Mild bulge. Mild facet degenerative changes and ligamentum flavum hypertrophy. Mild spinal stenosis. L3-4: Disc degeneration with disc space narrowing greater on the right. Mild bulge. Ligamentum flavum hypertrophy. Facet degenerative changes. Mild to slightly moderate spinal stenosis. L4-5: Facet degenerative changes. Bulge. Minimal anterior slip L4. Mild spinal stenosis. Mild right lateral recess narrowing. L5-S1: Mild disc degeneration with minimal bulge and spur. IMPRESSION: CT THORACIC SPINE IMPRESSION No thoracic compression fracture. CT LUMBAR SPINE IMPRESSION L2 superior endplate comminuted compression fracture with 50% loss height. Mild retropulsion posterosuperior aspect contributes to mild ventral thecal sac narrowing without high-grade spinal stenosis. Degenerative changes otherwise as detailed above. Aortic atherosclerosis. Electronically Signed   By: Lacy Duverney M.D.   On: 02/25/2016 16:45    EKG:   Ordered by me  IMPRESSION AND PLAN:   1.  Abdominal pain. CAT scan negative. Start on liquid diet. Case discussed with surgery to follow along. Not sure if this is just secondary to dementia or not.  2. Compression fracture L2. ER physician spoke with neurosurgery to evaluate patient tomorrow. 3. History of atrial fibrillation on Xarelto 4. Hyperlipidemia unspecified on Zocor 5. Type 2 diabetes mellitus. Sliding scale for now hold Lantus and Glucophage 6. Dementia with behavioral disturbance. Start Seroquel at night. When necessary Haldol during the day 7. Case discussed with daughter and patient is a full code. I will get a palliative care consultation.  All the records are reviewed and case discussed with ED provider. Management plans discussed with the patient, family and they are in agreement.  CODE STATUS: Full code  TOTAL TIME TAKING CARE OF THIS PATIENT: 50 minutes.    Alford Highland M.D on 02/25/2016 at 8:29 PM  Between 7am to 6pm - Pager -  [phone]  After 6pm call admission pager [phone]  [facility]  [phone]  CC: Primary care physician; No PCP Per Patient

## 2016-02-25 NOTE — ED Notes (Signed)
MRI called and reported pt could not tolerate scan. PT was calm but when noise began pt would scream and become combative and attempt to get off the table. Pt was not able to tolerate 30 seconds of scan.

## 2016-02-25 NOTE — ED Triage Notes (Signed)
Pt [facility] reporting continued pain in back after being dx recently with a compression fracture post fall. Pt was scheduled for an MRI today but did not have scan performed. Pt sent over to rule out further complications due to pts continued reports of pain. Pt screamed with transfer from stretcher to bed but when sitting in bed pt verbazlied not pain.   Hx of dementia and Afib pt is only oriented to self at this time and is reported to be at baseline.

## 2016-02-25 NOTE — ED Provider Notes (Addendum)
[facility] Emergency Department Provider Note  ____________________________________________  Time seen: Approximately 1:46 PM  I have reviewed the triage vital signs and the nursing notes.   HISTORY  Chief Complaint Back Pain  Level 5 caveat:  Portions of the history and physical were unable to be obtained due to dementia   HPI Deborah Snyder is a 83 y.o. female with a history of H fibrillation, dementia, diabetes, hypertension, hyperlipidemia who presents from orthopedics clinic for back pain.Patient was [facility] at [facility] and sent here for MRI and pain control. Patient was seen in the emergency department 2 weeks ago after a fall and had an x-ray with questionable L2 compression fracture. Since then patient has been taking Tylenol and tramadol for the pain but has had severe pain with movement. She has no pain at rest. Her pain is located in her lumbar spine. Patient has been now in a wheelchair for the last few days due to her pain. Patient has a history of dementia and is unable to give me any history at this time. Every time you mover and the stretcher she screams and holds her back.  Past Medical History:  Diagnosis Date  . Atrial fibrillation (HCC)   . Dementia   . Diabetes (HCC)   . Edema   . GERD (gastroesophageal reflux disease)   . Hyperlipidemia   . Hypertension     Patient Active Problem List   Diagnosis Date Noted  . Lumbar compression fracture (HCC) 02/25/2016  . Abdominal pain 02/25/2016  . Atrial fibrillation with rapid ventricular response (HCC) 08/26/2015    Past Surgical History:  Procedure Laterality Date  . NO PAST SURGERIES      Prior to Admission medications   Medication Sig Start Date End Date Taking? Authorizing Provider  acetaminophen (TYLENOL) 650 MG CR tablet Take 650 mg by mouth 3 (three) times daily.   Yes Historical Provider, MD  acetaminophen (TYLENOL) 650 MG suppository Place 650 mg rectally  every 6 (six) hours as needed.   Yes Historical Provider, MD  busPIRone (BUSPAR) 5 MG tablet Take 5 mg by mouth daily.   Yes Historical Provider, MD  diclofenac sodium (VOLTAREN) 1 % GEL Apply 4 g topically 4 (four) times daily as needed. Apply to low back and affected area 02/13/16  Yes Willy EddyPatrick Robinson, MD  furosemide (LASIX) 20 MG tablet Take 20 mg by mouth daily.    Yes Historical Provider, MD  insulin glargine (LANTUS) 100 UNIT/ML injection Inject 20 Units into the skin daily.    Yes Historical Provider, MD  latanoprost (XALATAN) 0.005 % ophthalmic solution Place 1 drop into both eyes at bedtime.    Yes Historical Provider, MD  lisinopril (PRINIVIL,ZESTRIL) 10 MG tablet Take 10 mg by mouth daily.   Yes Historical Provider, MD  metFORMIN (GLUCOPHAGE) 500 MG tablet Take 500 mg by mouth 2 (two) times daily.    Yes Historical Provider, MD  omega-3 acid ethyl esters (LOVAZA) 1 g capsule Take 1 g by mouth daily.   Yes Historical Provider, MD  potassium chloride (K-DUR) 10 MEQ tablet Take 10 mEq by mouth daily.   Yes Historical Provider, MD  rivaroxaban (XARELTO) 20 MG TABS tablet Take 20 mg by mouth every morning.    Yes Historical Provider, MD  simvastatin (ZOCOR) 40 MG tablet Take 40 mg by mouth at bedtime.    Yes Historical Provider, MD  traMADol (ULTRAM) 50 MG tablet Take by mouth every 6 (six) hours as  needed.   Yes Historical Provider, MD  HYDROcodone-acetaminophen (NORCO/VICODIN) 5-325 MG per tablet Take 0.5-1 tablets by mouth every 6 (six) hours as needed for moderate pain or severe pain. Patient taking differently: Take 1 tablet by mouth every 8 (eight) hours as needed for severe pain.  08/31/14   Lutricia Feil, PA-C    Allergies Sulfa antibiotics  Family History  Problem Relation Age of Onset  . Diabetes Other     Social History Social History  Substance Use Topics  . Smoking status: Never Smoker  . Smokeless tobacco: Never Used  . Alcohol use No    Review of  Systems  Constitutional: Negative for fever. Eyes: Negative for visual changes. ENT: Negative for sore throat. Neck: No neck pain  Cardiovascular: Negative for chest pain. Respiratory: Negative for shortness of breath. Gastrointestinal: Negative for abdominal pain, vomiting or diarrhea. Genitourinary: Negative for dysuria. Musculoskeletal: + back pain. Skin: Negative for rash. Neurological: Negative for headaches, weakness or numbness. Psych: No SI or HI  ____________________________________________   PHYSICAL EXAM:  VITAL SIGNS: ED Triage Vitals  Enc Vitals Group     BP 02/25/16 1319 119/90     Pulse Rate 02/25/16 1319 72     Resp 02/25/16 1319 16     Temp 02/25/16 1319 98 F (36.7 C)     Temp Source 02/25/16 1319 Oral     SpO2 02/25/16 1319 96 %     Weight 02/25/16 1320 196 lb (88.9 kg)     Height --      Head Circumference --      Peak Flow --      Pain Score --      Pain Loc --      Pain Edu? --      Excl. in GC? --     Constitutional: Alert and oriented. Well appearing and in no apparent distress. HEENT:      Head: Normocephalic and atraumatic.         Eyes: Conjunctivae are normal. Sclera is non-icteric. EOMI. PERRL      Mouth/Throat: Mucous membranes are moist.       Neck: Supple with no signs of meningismus. Cardiovascular: Regular rate and rhythm. No murmurs, gallops, or rubs. 2+ symmetrical distal pulses are present in all extremities. No JVD. Respiratory: Normal respiratory effort. Lungs are clear to auscultation bilaterally. No wheezes, crackles, or rhonchi.  Gastrointestinal: Soft, non tender, and non distended with positive bowel sounds. No rebound or guarding. Musculoskeletal: ttp over the lower thoracic, upper lumbar spine region. Nontender with normal range of motion in all extremities. No edema, cyanosis, or erythema of extremities. Neurologic: 1+ DTR b/l patella and absent Achilles bilaterally, patient will lift legs against gravity from noxious  stimuli but does not follow commands due to dementia. Rectal tone is intact. Skin: Skin is warm, dry and intact. No rash noted. Psychiatric: Mood and affect are normal. Speech and behavior are normal.  ____________________________________________   LABS (all labs ordered are listed, but only abnormal results are displayed)  Labs Reviewed  CBC - Abnormal; Notable for the following:       Result Value   WBC 14.3 (*)    RDW 15.2 (*)    All other components within normal limits  BASIC METABOLIC PANEL - Abnormal; Notable for the following:    Glucose, Bld 141 (*)    BUN 37 (*)    Creatinine, Ser 1.14 (*)    GFR calc non Af Amer 41 (*)  GFR calc Af Amer 47 (*)    All other components within normal limits  URINALYSIS, COMPLETE (UACMP) WITH MICROSCOPIC - Abnormal; Notable for the following:    Color, Urine YELLOW (*)    APPearance CLEAR (*)    Nitrite POSITIVE (*)    Bacteria, UA RARE (*)    Squamous Epithelial / LPF 0-5 (*)    All other components within normal limits  URINE CULTURE  BASIC METABOLIC PANEL  CBC   ____________________________________________  EKG  none ____________________________________________  RADIOLOGY  CT lumbar and thoracic spine: L2 superior endplate comminuted compression fracture with 50% loss height. Mild retropulsion posterosuperior aspect contributes to mild ventral thecal sac narrowing without high-grade spinal stenosis.  CT a/p: Negative ____________________________________________   PROCEDURES  Procedure(s) performed: None Procedures Critical Care performed:  None ____________________________________________   INITIAL IMPRESSION / ASSESSMENT AND PLAN / ED COURSE   83 y.o. female with a history of H fibrillation, dementia, diabetes, hypertension, hyperlipidemia who presents from orthopedics clinic for back pain x 2 weeks since recent fall, now on a wheelchair due to the pain. Sent here for MRI. History and exam limited due to  advanced dementia but intact rectal tone, able to lift leges against gravity from noxious stimuli, 1+ patella DTRs b/l. Patient is incontinent of urine and stool at baseline Will get MRI of thoracic and lumbar spine. Will give morphine for pain. Will check basic blood work and UA to rule out alternative causes of worsening back pain.  ED COURSE:  Patient unable to tolerate an MRI even with Ativan. A CT was done instead which showed an L2 superior endplate comminuted compression fracture with 50% loss of height, mild retropulsion without high-grade spinal stenosis. I discussed these findings with Dr. Adriana Simas, neurosurgery on call who recommended bed rest, TLSO brace and he will evaluate patient in the morning.  Clinical Course as of Feb 25 2040  Tue Feb 25, 2016  1804 I have discussed admission with the hospitalist service. When he was evaluating the patient he noticed the patient abdomen was not distended and and patient seemed to be tender with gentle palpation of her abdomen. This is new from when I evaluated patient upon arrival. She initially had no tenderness in her abdomen which was soft. UA is now back concerning for UTI, will give ceftriaxone. Will send patient for CT a/p.  [CV]    Clinical Course User Index [CV] Nita Sickle, MD    CT a/p had to be done w/o contrast due to patient's kidney function. Hospitalist requested that I consulted surgery for evaluation of her abdomen. Dr. Excell Seltzer evaluated patient and agrees that patient does not have an acute abdomen. Patient will be admitted to the Hospitalist service.    Pertinent labs & imaging results that were available during my care of the patient were reviewed by me and considered in my medical decision making (see chart for details).    ____________________________________________   FINAL CLINICAL IMPRESSION(S) / ED DIAGNOSES  Final diagnoses:  Back pain  Closed compression fracture of L2 lumbar vertebra with delayed healing,  subsequent encounter      NEW MEDICATIONS STARTED DURING THIS VISIT:  Current Discharge Medication List       Note:  This document was prepared using Dragon voice recognition software and may include unintentional dictation errors.    Nita Sickle, MD 02/25/16 1726    Nita Sickle, MD 02/25/16 2040    Nita Sickle, MD 02/25/16 2042

## 2016-02-26 ENCOUNTER — Observation Stay: Payer: Medicare Other

## 2016-02-26 DIAGNOSIS — Z7189 Other specified counseling: Secondary | ICD-10-CM | POA: Diagnosis not present

## 2016-02-26 DIAGNOSIS — R1010 Upper abdominal pain, unspecified: Secondary | ICD-10-CM | POA: Diagnosis not present

## 2016-02-26 DIAGNOSIS — Z515 Encounter for palliative care: Secondary | ICD-10-CM

## 2016-02-26 DIAGNOSIS — S32020A Wedge compression fracture of second lumbar vertebra, initial encounter for closed fracture: Secondary | ICD-10-CM | POA: Diagnosis not present

## 2016-02-26 DIAGNOSIS — S32020G Wedge compression fracture of second lumbar vertebra, subsequent encounter for fracture with delayed healing: Secondary | ICD-10-CM | POA: Diagnosis not present

## 2016-02-26 DIAGNOSIS — Z66 Do not resuscitate: Secondary | ICD-10-CM | POA: Diagnosis not present

## 2016-02-26 DIAGNOSIS — F0391 Unspecified dementia with behavioral disturbance: Secondary | ICD-10-CM | POA: Diagnosis not present

## 2016-02-26 DIAGNOSIS — F03918 Unspecified dementia, unspecified severity, with other behavioral disturbance: Secondary | ICD-10-CM

## 2016-02-26 LAB — BASIC METABOLIC PANEL
Anion gap: 7 (ref 5–15)
BUN: 35 mg/dL — ABNORMAL HIGH (ref 6–20)
CALCIUM: 8.5 mg/dL — AB (ref 8.9–10.3)
CHLORIDE: 114 mmol/L — AB (ref 101–111)
CO2: 25 mmol/L (ref 22–32)
Creatinine, Ser: 1.11 mg/dL — ABNORMAL HIGH (ref 0.44–1.00)
GFR calc non Af Amer: 42 mL/min — ABNORMAL LOW (ref >60)
GFR, EST AFRICAN AMERICAN: 49 mL/min — AB (ref >60)
GLUCOSE: 129 mg/dL — AB (ref 65–99)
Potassium: 3.8 mmol/L (ref 3.5–5.1)
Sodium: 146 mmol/L — ABNORMAL HIGH (ref 135–145)

## 2016-02-26 LAB — CBC
HCT: 33.2 % — ABNORMAL LOW (ref 35.0–47.0)
Hemoglobin: 11 g/dL — ABNORMAL LOW (ref 12.0–16.0)
MCH: 29.1 pg (ref 26.0–34.0)
MCHC: 33.2 g/dL (ref 32.0–36.0)
MCV: 87.8 fL (ref 80.0–100.0)
PLATELETS: 219 10*3/uL (ref 150–440)
RBC: 3.78 MIL/uL — AB (ref 3.80–5.20)
RDW: 14.8 % — ABNORMAL HIGH (ref 11.5–14.5)
WBC: 9.5 10*3/uL (ref 3.6–11.0)

## 2016-02-26 LAB — GLUCOSE, CAPILLARY
GLUCOSE-CAPILLARY: 115 mg/dL — AB (ref 65–99)
GLUCOSE-CAPILLARY: 147 mg/dL — AB (ref 65–99)

## 2016-02-26 LAB — MRSA PCR SCREENING: MRSA BY PCR: NEGATIVE

## 2016-02-26 MED ORDER — RIVAROXABAN 15 MG PO TABS
15.0000 mg | ORAL_TABLET | Freq: Every day | ORAL | Status: DC
  Administered 2016-02-27: 15 mg via ORAL
  Filled 2016-02-26: qty 1

## 2016-02-26 MED ORDER — INSULIN ASPART 100 UNIT/ML ~~LOC~~ SOLN
0.0000 [IU] | Freq: Three times a day (TID) | SUBCUTANEOUS | Status: DC
  Administered 2016-02-27: 1 [IU] via SUBCUTANEOUS
  Administered 2016-02-27: 2 [IU] via SUBCUTANEOUS
  Filled 2016-02-26: qty 1
  Filled 2016-02-26: qty 2

## 2016-02-26 MED ORDER — RIVAROXABAN 10 MG PO TABS: 10.0000 mg | ORAL_TABLET | Freq: Every day | ORAL | Status: DC

## 2016-02-26 MED ORDER — LORAZEPAM 2 MG/ML IJ SOLN: 0.5000 mg | Freq: Four times a day (QID) | INTRAMUSCULAR | Status: DC | PRN

## 2016-02-26 MED ORDER — HYDROCODONE-ACETAMINOPHEN 10-325 MG PO TABS: 1.0000 | ORAL_TABLET | ORAL | Status: DC | PRN

## 2016-02-26 NOTE — Consult Note (Signed)
Referring Physician:  No referring provider defined for this encounter.  Primary Physician:  No PCP Per Patient  Chief Complaint:  Back pain  History of Present Illness: Deborah Snyder is a 83 y.o. female who presents with the chief complaint of fall 1 week ago and severe back pain with weight bearing. She is unable to give history due to dementia.  It is unclear whether she has any leg weakness, as she is unable to move in bed secondary to pain.  She was brought in for evaluation yesterday, where CT showed lumbar spinal fracture. Neurosurgery was consulted for management.    Review of Systems:  A 10 point review of systems is negative, except for the pertinent positives and negatives detailed in the HPI.  Past Medical History: Past Medical History:  Diagnosis Date  . Atrial fibrillation (HCC)   . Dementia   . Diabetes (HCC)   . Edema   . GERD (gastroesophageal reflux disease)   . Hyperlipidemia   . Hypertension     Past Surgical History: Past Surgical History:  Procedure Laterality Date  . NO PAST SURGERIES      Allergies: Allergies as of 02/25/2016 - Review Complete 02/25/2016  Allergen Reaction Noted  . Sulfa antibiotics  08/31/2014    Medications:  Current Facility-Administered Medications:  .  0.9 %  sodium chloride infusion, , Intravenous, Continuous, Alford Highland, MD, Last Rate: 50 mL/hr at 02/25/16 2316 .  acetaminophen (TYLENOL) suppository 650 mg, 650 mg, Rectal, Q6H PRN, Alford Highland, MD .  busPIRone (BUSPAR) tablet 5 mg, 5 mg, Oral, Daily, Alford Highland, MD, 5 mg at 02/26/16 1051 .  diclofenac sodium (VOLTAREN) 1 % transdermal gel 4 g, 4 g, Topical, QID PRN, Alford Highland, MD .  insulin aspart (novoLOG) injection 0-9 Units, 0-9 Units, Subcutaneous, TID WC, Srikar Sudini, MD .  latanoprost (XALATAN) 0.005 % ophthalmic solution 1 drop, 1 drop, Both Eyes, QHS, Alford Highland, MD, 1 drop at 02/25/16 2332 .  lisinopril (PRINIVIL,ZESTRIL)  tablet 10 mg, 10 mg, Oral, Daily, Alford Highland, MD, 10 mg at 02/26/16 1051 .  morphine 2 MG/ML injection 1 mg, 1 mg, Intravenous, Q3H PRN, Alford Highland, MD, 1 mg at 02/25/16 2325 .  QUEtiapine (SEROQUEL) tablet 25 mg, 25 mg, Oral, QHS, Alford Highland, MD, 25 mg at 02/25/16 2325 .  rivaroxaban (XARELTO) tablet 20 mg, 20 mg, Oral, Daily, Alford Highland, MD, 20 mg at 02/26/16 1050 .  simvastatin (ZOCOR) tablet 40 mg, 40 mg, Oral, QHS, Alford Highland, MD, 40 mg at 02/25/16 2325 .  traMADol (ULTRAM) tablet 50 mg, 50 mg, Oral, Q6H PRN, Alford Highland, MD, 50 mg at 02/26/16 1051   Social History: Social History  Substance Use Topics  . Smoking status: Never Smoker  . Smokeless tobacco: Never Used  . Alcohol use No    Family Medical History: Family History  Problem Relation Age of Onset  . Diabetes Other     Physical Examination: Vitals:   02/26/16 0740 02/26/16 1140  BP: 134/75 132/72  Pulse: 70 69  Resp: 14 16  Temp: 98 F (36.7 C)      General: Patient is well developed, well nourished, calm, collected, and in no apparent distress when not moving.  Psychiatric: Patient is anxious.  Head:  Pupils equal, round, and reactive to light.  ENT:  Oral mucosa appears well hydrated.  Neck:   Supple.  Full range of motion.  Respiratory: Patient is breathing without any difficulty.  Extremities: No edema.  Vascular: Palpable pulses.  Skin:   On exposed skin, there are no abnormal skin lesions.  NEUROLOGICAL:  Awake, alert, oriented to name at times per daughter.  Pupils equal round and reactive to light.  Facial tone is slightly asymmetric due to left facial asymmetry.    She is uncooperative with exam. She has severe back pain with movement.  She grossly moves all extremities with at least 4/5 strength, but does not follow commands enough to get a more detailed motor examination. She will not participate with sensory examination.  Imaging: CLINICAL DATA:   83 year old female post fall with compression fracture. Subsequent encounter.  EXAM: CT THORACIC AND LUMBAR SPINE WITHOUT CONTRAST  TECHNIQUE: Multidetector CT imaging of the thoracic and lumbar spine was performed without contrast. Multiplanar CT image reconstructions were also generated.  COMPARISON:  02/13/2016 plain film exam. 02/28/2012 CT abdomen and pelvis. 08/02/1998 and chest CT.  FINDINGS: CT THORACIC SPINE FINDINGS  Alignment: Mild kyphosis.  Vertebrae: No thoracic spine compression fracture.  Paraspinal and other soft tissues: Small hiatal hernia. Mild cardiomegaly. Trace pericardial fluid. Prominent coronary artery calcifications. Atherosclerotic changes thoracic aorta. Motion degradation without worrisome lung lesion.  Disc levels: Scattered minimal degenerative changes without significant spinal stenosis.  CT LUMBAR SPINE FINDINGS  Segmentation:  Last fully open disc space L5-S1.  Alignment: Scoliosis convex left.  Vertebrae:  L2 superior endplate comminuted compression fracture with 50% loss height. Mild retropulsion posterosuperior aspect contributes to mild ventral thecal sac narrowing without high-grade spinal stenosis.  No other fracture noted.  Paraspinal and other soft tissues: Stable right renal 1.2 cm cyst.  Atherosclerotic changes aorta and common iliac arteries with ectasia. Post renal artery stenting.  Disc levels: L1-2:  Minimal bulge.  L2-3: Mild bulge. Mild facet degenerative changes and ligamentum flavum hypertrophy. Mild spinal stenosis.  L3-4: Disc degeneration with disc space narrowing greater on the right. Mild bulge. Ligamentum flavum hypertrophy. Facet degenerative changes. Mild to slightly moderate spinal stenosis.  L4-5: Facet degenerative changes. Bulge. Minimal anterior slip L4. Mild spinal stenosis. Mild right lateral recess narrowing.  L5-S1: Mild disc degeneration with minimal bulge and  spur.  IMPRESSION: CT THORACIC SPINE IMPRESSION  No thoracic compression fracture.  CT LUMBAR SPINE IMPRESSION  L2 superior endplate comminuted compression fracture with 50% loss height. Mild retropulsion posterosuperior aspect contributes to mild ventral thecal sac narrowing without high-grade spinal stenosis.  Degenerative changes otherwise as detailed above.  Aortic atherosclerosis.   Electronically Signed   By: Lacy DuverneySteven  Olson M.D.   On: 02/25/2016 16:45  I have personally reviewed the images and agree with the above interpretation.  Assessment and Plan: Ms. Hollice EspyGibson is a pleasant 83 y.o. female with stable L2 compression fracture.  I recommend bracing with a TLSO. This has been ordered. She will need upright films afterwards for a baseline comparison. I will see her back in clinic in 6 [facility].  We will follow with xrays.  If her fracture does not show healing at 6 weeks, I will send her for vertebroplasty.  I do not think operative intervention is indicated currently, and her current medical condition would preclude any major instrumented fusion. Her daughter was in agreement with this plan.  Thank you for involving me in the care of this patient. I will keep you apprised of the patient's progress.   Mattew Chriswell K. Myer HaffYarbrough MD, MPHS Dept. of Neurosurgery

## 2016-02-26 NOTE — Plan of Care (Signed)
Problem: Education: Goal: Knowledge of [facility] Education information/materials will improve Outcome: Not Progressing Pt is from a memory care unit. She has dementia and is unable to understand what is being taught.

## 2016-02-26 NOTE — Progress Notes (Signed)
Patient urine sample sent to lab.

## 2016-02-26 NOTE — Clinical Social Work Note (Signed)
Clinical Social Work Assessment  Patient Details  Name: Deborah Snyder MRN: [id number] Date of Birth: Mar 22, 1922  Date of referral:  02/26/16               Reason for consult:  Other (Comment Required) (From [facility] memory care )                Permission sought to share information with:  Facility Art therapist granted to share information::  Yes, Verbal Permission Granted  Name::        Agency::     Relationship::     Contact Information:     Housing/Transportation Living arrangements for the past 2 months:  [place] of Information:  Power of Attorney, Adult Children, Facility Patient Interpreter Needed:  None Criminal Activity/Legal Involvement Pertinent to Current Situation/Hospitalization:  No - Comment as needed Significant Relationships:  Adult Children Lives with:  Facility Resident Do you feel safe going back to the place where you live?    Need for family participation in patient care:  Yes (Comment)  Care giving concerns:  Patient is a long term care resident at [facility] (fax: [phone]).    Social Worker assessment / plan:  Holiday representative ([place]) reviewed chart and noted that patient is from [place]. CSW contacted [facility] and spoke with [facility] Coordinator for the memory care unit. Per Sunday Spillers patient has been a resident since 12/12/13 in the memory care unit. Sunday Spillers reported that patient was walking with a walker before she fell on 02/13/16. Since 02/13/16 patient has not been walking and has been using a wheel chair. Per Sunday Spillers patient is on room air at baseline and has a compression fracture. Sunday Spillers reported that patient was open to home health PT through [facility]. Per Sunday Spillers patient's daughter Hoyle Sauer is POA. Sunday Spillers reported that patient can return when stable. CSW met with patient and her daughter Hoyle Sauer at bedside. CSW explained that patient is under observation,  which means that medicare will not pay for SNF. CSW also explained that if patient needs a higher level of care then she can be placed under her long term care medicaid. Daughter reported that if patient needs a higher level of care then [facility] in [place] would be a preference. Per daughter patient is a member at [employer] and the community. CSW explained the barriers to [facility] beds including how limited they are. Daughter is agreeable for patient to return to [facility] memory care if that is what is best. Neuro consult is pending. CSW will continue to follow and assist as needed.   Employment status:  Disabled (Comment on whether or not currently receiving Disability), Retired Forensic scientist:  Medicare, Medicaid In [place] PT Recommendations:  Not assessed at this time Information / Referral to community resources:  [place] (SNF VS. ALF )  Patient/Family's Response to care:  Patient's daughter is open to what neuro recommends.   Patient/Family's Understanding of and Emotional Response to Diagnosis, Current Treatment, and Prognosis:  Patient's daughter is realistic about patient's care and stated that she is 83 y.o and may never recover from this. Daughter is open to palliative.   Emotional Assessment Appearance:  Appears stated age Attitude/Demeanor/Rapport:  Crying, Lethargic, Uncooperative Affect (typically observed):  Unable to Assess Orientation:  Oriented to Self, Fluctuating Orientation (Suspected and/or reported Sundowners) Alcohol / Substance use:  Not Applicable Psych involvement (Current and /or in the community):  No (Comment)  Discharge Needs  Concerns to be addressed:  Discharge Planning Concerns Readmission within the last 30 days:  No Current discharge risk:  Chronically ill, Cognitively Impaired, Dependent with Mobility Barriers to Discharge:  Continued Medical Work up   [employer], Veronia Beets, LCSW 02/26/2016, 11:04 AM

## 2016-02-26 NOTE — Progress Notes (Signed)
[employer] - [place] at [facility]   PATIENT NAME: Deborah Snyder    MR#:  [id number]  DATE OF BIRTH:  1922-03-28  SUBJECTIVE:  CHIEF COMPLAINT:   Chief Complaint  Patient presents with  . Back Pain   Non verbal. Distressed due to pain  REVIEW OF SYSTEMS:    Review of Systems  Unable to perform ROS: Dementia    DRUG ALLERGIES:   Allergies  Allergen Reactions  . Sulfa Antibiotics     VITALS:  Blood pressure 132/72, pulse 69, temperature 98 F (36.7 C), temperature source Oral, resp. rate 16, height 5' 5.98" (1.676 m), weight 85.8 kg (189 lb 3.2 oz), SpO2 97 %.  PHYSICAL EXAMINATION:   Physical Exam  GENERAL:  83 y.o.-year-old patient lying in the bed. restless EYES: Pupils equal, round, reactive to light and accommodation. No scleral icterus. Extraocular muscles intact.  HEENT: Head atraumatic, normocephalic. Oropharynx and nasopharynx clear.  NECK:  Supple, no jugular venous distention. No thyroid enlargement, no tenderness.  LUNGS: Normal breath sounds bilaterally, no wheezing, rales, rhonchi. No use of accessory muscles of respiration.  CARDIOVASCULAR: S1, S2 normal. No murmurs, rubs, or gallops.  ABDOMEN: Soft, nondistended. Bowel sounds present. No organomegaly or mass. Tender diffusely EXTREMITIES: No cyanosis, clubbing or edema b/l.    NEUROLOGIC: Moves all 4 extremities PSYCHIATRIC: The patient is restless, confused  LABORATORY PANEL:   CBC  Recent Labs Lab 02/26/16 0407  WBC 9.5  HGB 11.0*  HCT 33.2*  PLT 219   ------------------------------------------------------------------------------------------------------------------ Chemistries   Recent Labs Lab 02/26/16 0407  NA 146*  K 3.8  CL 114*  CO2 25  GLUCOSE 129*  BUN 35*  CREATININE 1.11*  CALCIUM 8.5*   ------------------------------------------------------------------------------------------------------------------  Cardiac Enzymes No results for input(s):  TROPONINI in the last 168 hours. ------------------------------------------------------------------------------------------------------------------  RADIOLOGY:  Ct Abdomen Pelvis Wo Contrast  Result Date: 02/25/2016 CLINICAL DATA:  Compression fracture of lumbar spine. Addendum distention with acute tenderness and guarding. EXAM: CT ABDOMEN AND PELVIS WITHOUT CONTRAST TECHNIQUE: Multidetector CT imaging of the abdomen and pelvis was performed following the standard protocol without IV contrast. COMPARISON:  Same day thoracolumbar spine CT FINDINGS: Lower chest: Cardiomegaly with coronary arteriosclerosis. No pericardial effusion. Mild subpleural areas of atelectasis and/or fibrosis. Faint ground-glass opacities may be secondary to hypoventilatory change at each lung base. Hepatobiliary: The unenhanced liver is unremarkable. No biliary dilatation. The patient is status post cholecystectomy. Pancreas: No pancreatic mass or ductal dilatation. Mild fatty atrophy. Spleen: No splenomegaly or mass. Adrenals/Urinary Tract: Mild thickening of left adrenal apex. No adrenal nodule or mass. No nephrolithiasis nor obstructive uropathy. There appear to be interpolar cysts off the right kidney measuring 1.5 cm anteriorly and 0.8 cm posteriorly. Stomach/Bowel: The stomach is contracted. There is normal small bowel rotation. No small bowel dilatation. There is extensive diverticulosis distal transverse colon sigmoid. No acute inflammation. No large bowel dilatation. Numerous sutures noted in the right hemiabdomen. Appendix not visualized. The appendix may be surgically absent. Vascular/Lymphatic: Aortic and branch vessel atherosclerosis. Bilateral renal artery stents at the origins. No aneurysm no adenopathy by CT criteria. Reproductive: Hysterectomy.  No adnexal mass. Other: Small fat containing umbilical hernia. No free air nor free fluid. Musculoskeletal: 2 superior endplate compression fracture with 50% height loss and  slight retropulsion of posterosuperior aspect. Degenerative disc space narrowing L5-S1 with multilevel facet arthropathy. IMPRESSION: 1. Compression fracture of L2 with slight retropulsion but without significant canal stenosis. 2. No acute intra-abdominal or pelvic solid nor hollow  visceral organ abnormality. 3. Extensive colonic diverticulosis without acute diverticulitis. No bowel obstruction or perforation. 4. Aortic and branch vessel atherosclerosis. Bilateral renal artery stents. 5. Right-sided interpolar renal cysts the largest 1.5 cm and simple in appearance. 6. Cholecystectomy. 7. Appendix not visualized and may be surgically absent. No pericecal inflammation. 8. Cardiomegaly with coronary arteriosclerosis. Electronically Signed   By: Tollie Eth M.D.   On: 02/25/2016 18:37   Ct Thoracic Spine Wo Contrast  Result Date: 02/25/2016 CLINICAL DATA:  83 year old female post fall with compression fracture. Subsequent encounter. EXAM: CT THORACIC AND LUMBAR SPINE WITHOUT CONTRAST TECHNIQUE: Multidetector CT imaging of the thoracic and lumbar spine was performed without contrast. Multiplanar CT image reconstructions were also generated. COMPARISON:  02/13/2016 plain film exam. 02/28/2012 CT abdomen and pelvis. 08/02/1998 and chest CT. FINDINGS: CT THORACIC SPINE FINDINGS Alignment: Mild kyphosis. Vertebrae: No thoracic spine compression fracture. Paraspinal and other soft tissues: Small hiatal hernia. Mild cardiomegaly. Trace pericardial fluid. Prominent coronary artery calcifications. Atherosclerotic changes thoracic aorta. Motion degradation without worrisome lung lesion. Disc levels: Scattered minimal degenerative changes without significant spinal stenosis. CT LUMBAR SPINE FINDINGS Segmentation:  Last fully open disc space L5-S1. Alignment: Scoliosis convex left. Vertebrae: L2 superior endplate comminuted compression fracture with 50% loss height. Mild retropulsion posterosuperior aspect contributes to mild  ventral thecal sac narrowing without high-grade spinal stenosis. No other fracture noted. Paraspinal and other soft tissues: Stable right renal 1.2 cm cyst. Atherosclerotic changes aorta and common iliac arteries with ectasia. Post renal artery stenting. Disc levels: L1-2:  Minimal bulge. L2-3: Mild bulge. Mild facet degenerative changes and ligamentum flavum hypertrophy. Mild spinal stenosis. L3-4: Disc degeneration with disc space narrowing greater on the right. Mild bulge. Ligamentum flavum hypertrophy. Facet degenerative changes. Mild to slightly moderate spinal stenosis. L4-5: Facet degenerative changes. Bulge. Minimal anterior slip L4. Mild spinal stenosis. Mild right lateral recess narrowing. L5-S1: Mild disc degeneration with minimal bulge and spur. IMPRESSION: CT THORACIC SPINE IMPRESSION No thoracic compression fracture. CT LUMBAR SPINE IMPRESSION L2 superior endplate comminuted compression fracture with 50% loss height. Mild retropulsion posterosuperior aspect contributes to mild ventral thecal sac narrowing without high-grade spinal stenosis. Degenerative changes otherwise as detailed above. Aortic atherosclerosis. Electronically Signed   By: Lacy Duverney M.D.   On: 02/25/2016 16:45   Ct Lumbar Spine Wo Contrast  Result Date: 02/25/2016 CLINICAL DATA:  83 year old female post fall with compression fracture. Subsequent encounter. EXAM: CT THORACIC AND LUMBAR SPINE WITHOUT CONTRAST TECHNIQUE: Multidetector CT imaging of the thoracic and lumbar spine was performed without contrast. Multiplanar CT image reconstructions were also generated. COMPARISON:  02/13/2016 plain film exam. 02/28/2012 CT abdomen and pelvis. 08/02/1998 and chest CT. FINDINGS: CT THORACIC SPINE FINDINGS Alignment: Mild kyphosis. Vertebrae: No thoracic spine compression fracture. Paraspinal and other soft tissues: Small hiatal hernia. Mild cardiomegaly. Trace pericardial fluid. Prominent coronary artery calcifications.  Atherosclerotic changes thoracic aorta. Motion degradation without worrisome lung lesion. Disc levels: Scattered minimal degenerative changes without significant spinal stenosis. CT LUMBAR SPINE FINDINGS Segmentation:  Last fully open disc space L5-S1. Alignment: Scoliosis convex left. Vertebrae: L2 superior endplate comminuted compression fracture with 50% loss height. Mild retropulsion posterosuperior aspect contributes to mild ventral thecal sac narrowing without high-grade spinal stenosis. No other fracture noted. Paraspinal and other soft tissues: Stable right renal 1.2 cm cyst. Atherosclerotic changes aorta and common iliac arteries with ectasia. Post renal artery stenting. Disc levels: L1-2:  Minimal bulge. L2-3: Mild bulge. Mild facet degenerative changes and ligamentum flavum hypertrophy. Mild spinal stenosis.  L3-4: Disc degeneration with disc space narrowing greater on the right. Mild bulge. Ligamentum flavum hypertrophy. Facet degenerative changes. Mild to slightly moderate spinal stenosis. L4-5: Facet degenerative changes. Bulge. Minimal anterior slip L4. Mild spinal stenosis. Mild right lateral recess narrowing. L5-S1: Mild disc degeneration with minimal bulge and spur. IMPRESSION: CT THORACIC SPINE IMPRESSION No thoracic compression fracture. CT LUMBAR SPINE IMPRESSION L2 superior endplate comminuted compression fracture with 50% loss height. Mild retropulsion posterosuperior aspect contributes to mild ventral thecal sac narrowing without high-grade spinal stenosis. Degenerative changes otherwise as detailed above. Aortic atherosclerosis. Electronically Signed   By: Lacy Duverney M.D.   On: 02/25/2016 16:45   ASSESSMENT AND PLAN:   1.  Abdominal pain. CAT scan negative.  Regular diet Seen by surgery and not thought to be acute abdomen. Likely abdominal muscle pain.  2. Compression fracture L2 Discussed with Neurosurgery TLSO brace Pain control No plans for Kyphoplasty  3. History of  atrial fibrillation on Xarelto  4. Hyperlipidemia unspecified on Zocor  5. Type 2 diabetes mellitus. Sliding scale insulin  6. Dementia with behavioral disturbance When necessary Haldol during the day  All the records are reviewed and case discussed with Care Management/Social Workerr. Management plans discussed with the patient, family and they are in agreement.  CODE STATUS: DNR  DVT Prophylaxis: SCDs  TOTAL TIME TAKING CARE OF THIS PATIENT: 35 minutes.   POSSIBLE D/C IN 1-2 DAYS, DEPENDING ON CLINICAL CONDITION.  Milagros Loll R M.D on 02/26/2016 at 12:39 PM  Between 7am to 6pm - Pager - [phone]  After 6pm go to [email or web address] - password [email or web address]  [facility]  Office  [phone]  CC: Primary care physician; No PCP Per Patient  Note: This dictation was prepared with Dragon dictation along with smaller phrase technology. Any transcriptional errors that result from this process are unintentional.

## 2016-02-26 NOTE — Progress Notes (Signed)
CC: Abdominal pain Subjective: Pt is demented, no ROS possible Per Nursing no significant abd pain She does have back pain from lumbar fx  Objective: Vital signs in last 24 hours: Temp:  [98 F (36.7 C)-98.5 F (36.9 C)] 98 F (36.7 C) (02/14 0740) Pulse Rate:  [56-96] 69 (02/14 1140) Resp:  [14-19] 16 (02/14 1140) BP: (107-146)/(66-96) 132/72 (02/14 1140) SpO2:  [94 %-100 %] 97 % (02/14 1140) Weight:  [85.8 kg (189 lb 3.2 oz)-88.9 kg (196 lb)] 85.8 kg (189 lb 3.2 oz) (02/13 2049)    Intake/Output from previous day: No intake/output data recorded. Intake/Output this shift: No intake/output data recorded.  Physical exam:Elderly and debilitated female in NAD Chest: no chest wall tenderness, no resp distress. Ventilating well Abd: soft, NT, no peritonitis Ext: well perfused and warm Lab Results: CBC   Recent Labs  02/25/16 1437 02/26/16 0407  WBC 14.3* 9.5  HGB 12.4 11.0*  HCT 36.7 33.2*  PLT 266 219   BMET  Recent Labs  02/25/16 1437 02/26/16 0407  NA 143 146*  K 4.0 3.8  CL 107 114*  CO2 26 25  GLUCOSE 141* 129*  BUN 37* 35*  CREATININE 1.14* 1.11*  CALCIUM 9.0 8.5*   PT/INR No results for input(s): LABPROT, INR in the last 72 hours. ABG No results for input(s): PHART, HCO3 in the last 72 hours.  Invalid input(s): PCO2, PO2  Studies/Results: Ct Abdomen Pelvis Wo Contrast  Result Date: 02/25/2016 CLINICAL DATA:  Compression fracture of lumbar spine. Addendum distention with acute tenderness and guarding. EXAM: CT ABDOMEN AND PELVIS WITHOUT CONTRAST TECHNIQUE: Multidetector CT imaging of the abdomen and pelvis was performed following the standard protocol without IV contrast. COMPARISON:  Same day thoracolumbar spine CT FINDINGS: Lower chest: Cardiomegaly with coronary arteriosclerosis. No pericardial effusion. Mild subpleural areas of atelectasis and/or fibrosis. Faint ground-glass opacities may be secondary to hypoventilatory change at each lung base.  Hepatobiliary: The unenhanced liver is unremarkable. No biliary dilatation. The patient is status post cholecystectomy. Pancreas: No pancreatic mass or ductal dilatation. Mild fatty atrophy. Spleen: No splenomegaly or mass. Adrenals/Urinary Tract: Mild thickening of left adrenal apex. No adrenal nodule or mass. No nephrolithiasis nor obstructive uropathy. There appear to be interpolar cysts off the right kidney measuring 1.5 cm anteriorly and 0.8 cm posteriorly. Stomach/Bowel: The stomach is contracted. There is normal small bowel rotation. No small bowel dilatation. There is extensive diverticulosis distal transverse colon sigmoid. No acute inflammation. No large bowel dilatation. Numerous sutures noted in the right hemiabdomen. Appendix not visualized. The appendix may be surgically absent. Vascular/Lymphatic: Aortic and branch vessel atherosclerosis. Bilateral renal artery stents at the origins. No aneurysm no adenopathy by CT criteria. Reproductive: Hysterectomy.  No adnexal mass. Other: Small fat containing umbilical hernia. No free air nor free fluid. Musculoskeletal: 2 superior endplate compression fracture with 50% height loss and slight retropulsion of posterosuperior aspect. Degenerative disc space narrowing L5-S1 with multilevel facet arthropathy. IMPRESSION: 1. Compression fracture of L2 with slight retropulsion but without significant canal stenosis. 2. No acute intra-abdominal or pelvic solid nor hollow visceral organ abnormality. 3. Extensive colonic diverticulosis without acute diverticulitis. No bowel obstruction or perforation. 4. Aortic and branch vessel atherosclerosis. Bilateral renal artery stents. 5. Right-sided interpolar renal cysts the largest 1.5 cm and simple in appearance. 6. Cholecystectomy. 7. Appendix not visualized and may be surgically absent. No pericecal inflammation. 8. Cardiomegaly with coronary arteriosclerosis. Electronically Signed   By: Tollie Ethavid  Kwon M.D.   On: 02/25/2016  18:37  Ct Thoracic Spine Wo Contrast  Result Date: 02/25/2016 CLINICAL DATA:  83 year old female post fall with compression fracture. Subsequent encounter. EXAM: CT THORACIC AND LUMBAR SPINE WITHOUT CONTRAST TECHNIQUE: Multidetector CT imaging of the thoracic and lumbar spine was performed without contrast. Multiplanar CT image reconstructions were also generated. COMPARISON:  02/13/2016 plain film exam. 02/28/2012 CT abdomen and pelvis. 08/02/1998 and chest CT. FINDINGS: CT THORACIC SPINE FINDINGS Alignment: Mild kyphosis. Vertebrae: No thoracic spine compression fracture. Paraspinal and other soft tissues: Small hiatal hernia. Mild cardiomegaly. Trace pericardial fluid. Prominent coronary artery calcifications. Atherosclerotic changes thoracic aorta. Motion degradation without worrisome lung lesion. Disc levels: Scattered minimal degenerative changes without significant spinal stenosis. CT LUMBAR SPINE FINDINGS Segmentation:  Last fully open disc space L5-S1. Alignment: Scoliosis convex left. Vertebrae: L2 superior endplate comminuted compression fracture with 50% loss height. Mild retropulsion posterosuperior aspect contributes to mild ventral thecal sac narrowing without high-grade spinal stenosis. No other fracture noted. Paraspinal and other soft tissues: Stable right renal 1.2 cm cyst. Atherosclerotic changes aorta and common iliac arteries with ectasia. Post renal artery stenting. Disc levels: L1-2:  Minimal bulge. L2-3: Mild bulge. Mild facet degenerative changes and ligamentum flavum hypertrophy. Mild spinal stenosis. L3-4: Disc degeneration with disc space narrowing greater on the right. Mild bulge. Ligamentum flavum hypertrophy. Facet degenerative changes. Mild to slightly moderate spinal stenosis. L4-5: Facet degenerative changes. Bulge. Minimal anterior slip L4. Mild spinal stenosis. Mild right lateral recess narrowing. L5-S1: Mild disc degeneration with minimal bulge and spur. IMPRESSION: CT  THORACIC SPINE IMPRESSION No thoracic compression fracture. CT LUMBAR SPINE IMPRESSION L2 superior endplate comminuted compression fracture with 50% loss height. Mild retropulsion posterosuperior aspect contributes to mild ventral thecal sac narrowing without high-grade spinal stenosis. Degenerative changes otherwise as detailed above. Aortic atherosclerosis. Electronically Signed   By: Lacy Duverney M.D.   On: 02/25/2016 16:45   Ct Lumbar Spine Wo Contrast  Result Date: 02/25/2016 CLINICAL DATA:  83 year old female post fall with compression fracture. Subsequent encounter. EXAM: CT THORACIC AND LUMBAR SPINE WITHOUT CONTRAST TECHNIQUE: Multidetector CT imaging of the thoracic and lumbar spine was performed without contrast. Multiplanar CT image reconstructions were also generated. COMPARISON:  02/13/2016 plain film exam. 02/28/2012 CT abdomen and pelvis. 08/02/1998 and chest CT. FINDINGS: CT THORACIC SPINE FINDINGS Alignment: Mild kyphosis. Vertebrae: No thoracic spine compression fracture. Paraspinal and other soft tissues: Small hiatal hernia. Mild cardiomegaly. Trace pericardial fluid. Prominent coronary artery calcifications. Atherosclerotic changes thoracic aorta. Motion degradation without worrisome lung lesion. Disc levels: Scattered minimal degenerative changes without significant spinal stenosis. CT LUMBAR SPINE FINDINGS Segmentation:  Last fully open disc space L5-S1. Alignment: Scoliosis convex left. Vertebrae: L2 superior endplate comminuted compression fracture with 50% loss height. Mild retropulsion posterosuperior aspect contributes to mild ventral thecal sac narrowing without high-grade spinal stenosis. No other fracture noted. Paraspinal and other soft tissues: Stable right renal 1.2 cm cyst. Atherosclerotic changes aorta and common iliac arteries with ectasia. Post renal artery stenting. Disc levels: L1-2:  Minimal bulge. L2-3: Mild bulge. Mild facet degenerative changes and ligamentum flavum  hypertrophy. Mild spinal stenosis. L3-4: Disc degeneration with disc space narrowing greater on the right. Mild bulge. Ligamentum flavum hypertrophy. Facet degenerative changes. Mild to slightly moderate spinal stenosis. L4-5: Facet degenerative changes. Bulge. Minimal anterior slip L4. Mild spinal stenosis. Mild right lateral recess narrowing. L5-S1: Mild disc degeneration with minimal bulge and spur. IMPRESSION: CT THORACIC SPINE IMPRESSION No thoracic compression fracture. CT LUMBAR SPINE IMPRESSION L2 superior endplate comminuted compression fracture with 50% loss  height. Mild retropulsion posterosuperior aspect contributes to mild ventral thecal sac narrowing without high-grade spinal stenosis. Degenerative changes otherwise as detailed above. Aortic atherosclerosis. Electronically Signed   By: Lacy Duverney M.D.   On: 02/25/2016 16:45    Anti-infectives: Anti-infectives    Start     Dose/Rate Route Frequency Ordered Stop   02/25/16 1815  cefTRIAXone (ROCEPHIN) 1 g in dextrose 5 % 50 mL IVPB  Status:  Discontinued     1 g 100 mL/hr over 30 Minutes Intravenous  Once 02/25/16 1806 02/25/16 1807   02/25/16 1815  cefTRIAXone (ROCEPHIN) IVPB 1 g    Comments:  UTI   1 g 100 mL/hr over 30 Minutes Intravenous  Once 02/25/16 1808 02/25/16 1845      Assessment/Plan: Questionable abdominal pain, now pain is gone, normal CT, likely Musculoskeletal or related to her spine fracture. No surgical intervention. We will be available if needed.  Sterling Big, MD, [facility]  02/26/2016

## 2016-02-26 NOTE — Care Management Obs Status (Signed)
MEDICARE OBSERVATION STATUS NOTIFICATION   Patient Details  Name: Deborah Snyder MRN: [id number] Date of Birth: 1922/06/29   Medicare Observation Status Notification Given:  Yes. Spoke with daughter by phone and explained form. She verbalized understanding. Form left at bedside.     Marily MemosLisa M Addylin Manke, RN 02/26/2016, 2:05 PM

## 2016-02-26 NOTE — [place] FL2 (Addendum)
[place] MEDICAID FL2 LEVEL OF CARE SCREENING TOOL     IDENTIFICATION  Patient Name: Deborah Snyder Birthdate: Mar 13, 1922 Sex: female Admission Date (Current Location): 02/25/2016  [facility] [place]  [place]  ([id number]) Facility and Address:  [facility], [address], [place], [place] [address]      Provider Number: [id number]  Attending Physician Name and Address:  Alford Highlandichard Wieting, MD  Relative Name and Phone Number:       Current Level of Care: Hospital Recommended Level of Care: SNF  Prior Approval Number:    Date Approved/Denied:   PASRR Number:  [id number] )  Discharge Plan: SNF     Current Diagnoses: Patient Active Problem List   Diagnosis Date Noted  . Lumbar compression fracture (HCC) 02/25/2016  . Abdominal pain 02/25/2016  . Atrial fibrillation with rapid ventricular response (HCC) 08/26/2015    Orientation RESPIRATION BLADDER Height & Weight     Self  Normal Incontinent Weight: 189 lb 3.2 oz (85.8 kg) Height:  5' 5.98" (167.6 cm)  BEHAVIORAL SYMPTOMS/MOOD NEUROLOGICAL BOWEL NUTRITION STATUS   (none)  (none) Continent Diet (Diet: Clear Liquid )  AMBULATORY STATUS COMMUNICATION OF NEEDS Skin   Extensive Assist Verbally Normal                       Personal Care Assistance Level of Assistance  Bathing, Feeding, Dressing Bathing Assistance: Limited assistance Feeding assistance: Limited assistance Dressing Assistance: Limited assistance     Functional Limitations Info  Sight, Hearing, Speech Sight Info: Adequate Hearing Info: Adequate Speech Info: Adequate    SPECIAL CARE FACTORS FREQUENCY  PT (By licensed PT)     PT Frequency:  (2-3 days per week via home health)              Contractures      Additional Factors Info  Code Status, Allergies Code Status Info:  (Full Code. ) Allergies Info:  (Sulfa Antibiotics)           Current Medications (02/26/2016):  This is the current  hospital active medication list Current Facility-Administered Medications  Medication Dose Route Frequency Provider Last Rate Last Dose  . 0.9 %  sodium chloride infusion   Intravenous Continuous Alford Highlandichard Wieting, MD 50 mL/hr at 02/25/16 2316    . acetaminophen (TYLENOL) suppository 650 mg  650 mg Rectal Q6H PRN Alford Highlandichard Wieting, MD      . busPIRone (BUSPAR) tablet 5 mg  5 mg Oral Daily Alford Highlandichard Wieting, MD      . diclofenac sodium (VOLTAREN) 1 % transdermal gel 4 g  4 g Topical QID PRN Alford Highlandichard Wieting, MD      . latanoprost (XALATAN) 0.005 % ophthalmic solution 1 drop  1 drop Both Eyes QHS Alford Highlandichard Wieting, MD   1 drop at 02/25/16 2332  . lisinopril (PRINIVIL,ZESTRIL) tablet 10 mg  10 mg Oral Daily Alford Highlandichard Wieting, MD      . morphine 2 MG/ML injection 1 mg  1 mg Intravenous Q3H PRN Alford Highlandichard Wieting, MD   1 mg at 02/25/16 2325  . QUEtiapine (SEROQUEL) tablet 25 mg  25 mg Oral QHS Alford Highlandichard Wieting, MD   25 mg at 02/25/16 2325  . rivaroxaban (XARELTO) tablet 20 mg  20 mg Oral Daily Alford Highlandichard Wieting, MD      . simvastatin (ZOCOR) tablet 40 mg  40 mg Oral QHS Alford Highlandichard Wieting, MD   40 mg at 02/25/16 2325  . traMADol (ULTRAM) tablet 50 mg  50 mg Oral Q6H PRN Alford Highland, MD         Discharge Medications: Please see discharge summary for a list of discharge medications.  Relevant Imaging Results:  Relevant Lab Results:   Additional Information  (SSN: [id number])  Kataryna Mcquilkin, Darleen Crocker, LCSW

## 2016-02-26 NOTE — Consult Note (Signed)
Consultation Note Date: 02/26/2016   Patient Name: Deborah Snyder  DOB: 12/18/1922  MRN: [id number]  Age / Sex: 83 y.o., female  PCP: No Pcp Per Patient Referring Physician: Loletha Grayer, MD  Reason for Consultation: Establishing goals of care  HPI/Patient Profile: 83 y.o. female  with past medical history of dementia, hypertension, hyperlipidemia, diabetes, atrial fibrillation, and GERD admitted on 02/25/2016 with back pain and recent fall. CT scan lumbar spine reveals L2 compression fracture with 50% loss of height. Unable to obtain MRI. Patient also with severe abdominal pain-CT negative for acute event. Surgery has evaluated with no sign of acute abdomen or need for surgical intervention. Neurosurgery consult pending. Palliative medicine consultation for goals of care.    Clinical Assessment and Goals of Care: I have reviewed medical records and met with patient, daughter Deborah Snyder), and grandson Deborah Snyder) at bedside to discuss diagnosis, prognosis, GOC, EOL wishes, disposition and options.  Introduced Palliative Medicine as specialized medical care for people living with serious illness. It focuses on providing relief from the symptoms and stress of a serious illness. The goal is to improve quality of life for both the patient and the family.  We discussed a brief life review of the patient. She is a widow and has six children and many grandchildren/great-grandchildren. Deborah Snyder tells me she has lived at a memory care unit for 3-4 years. Prior to hospitalization and fall one week ago, her baseline was alert, pleasantly confused, ambulatory with walker, and with great appetite. Since the fall last week, she has been wheelchair bound and in excruciating pain. Deborah Snyder tells me they were sent from the ortho office to hospital yesterday. Prior to dementia, Deborah Snyder was a hardworking individual who loved to paint,  sing, attend church, and spend time with her family.     Discussed current hospitalization. Family is waiting on recommendation from neurosurgery regarding plan. We discussed the natural trajectory of dementia and family understands this is a chronic, progressive disease.   Advanced directives, concepts specific to code status, and artifical feeding and hydration considered and discussed. Daughter, Deborah Snyder is financial POA and her sister Deborah Snyder is Economist. Deborah Snyder speaks her frustrations with her sister "not being available" and involved in her mother's care anymore, therefore all of the decisions have fallen on Deborah Snyder to make. We discussed resuscitation and life support. After educating Deborah Snyder on CPR/life support and my recommendation for DNR/DNI with age and dementia, she tells me her mother would want to die a natural death when the time comes. She would "not want to live as a vegetable on tubes." Deborah Snyder also tells me her mother would NOT want a feeding tube to keep her alive.  Palliative Care services outpatient were explained and offered. Deborah Snyder agrees with palliative to follow when she is discharged.   Questions and concerns were addressed.  Hard Choices booklet left for review.    SUMMARY OF RECOMMENDATIONS    Discussed and education on code status and my recommendation for DNR/DNI with age and dementia. Daughter agrees with  DNR/DNI. She wishes for her mother to die a natural death.  Durable DNR placed in chart.   Continue current interventions. Family waiting on neurosurgery recommendation.  Daughter agreeable with palliative services to follow outpatient.   PMT will continue to shadow chart and support patient and family during hospitalization.   Code Status/Advance Care Planning:  DNR   Symptom Management:   Per attending  Palliative Prophylaxis:   Aspiration, Bowel Regimen, Delirium Protocol, Frequent Pain Assessment, Oral Care and Turn  Reposition  Psycho-social/Spiritual:   Desire for further Chaplaincy support:no  Additional Recommendations: Caregiving  Support/Resources and Education on Hospice  Prognosis:   Unable to determine  Discharge Planning: To Be Determined      Primary Diagnoses: Present on Admission: . Lumbar compression fracture (Deborah Snyder) . Abdominal pain   I have reviewed the medical record, interviewed the patient and family, and examined the patient. The following aspects are pertinent.  Past Medical History:  Diagnosis Date  . Atrial fibrillation (Deborah Snyder)   . Dementia   . Diabetes (Deborah Snyder)   . Edema   . GERD (gastroesophageal reflux disease)   . Hyperlipidemia   . Hypertension    Social History   Social History  . Marital status: Widowed    Spouse name: Deborah Snyder  . Number of children: Deborah Snyder  . Years of education: Deborah Snyder   Social History Main Topics  . Smoking status: Never Smoker  . Smokeless tobacco: Never Used  . Alcohol use No  . Drug use: No  . Sexual activity: Not Asked   Other Topics Concern  . None   Social History Narrative  . None   Family History  Problem Relation Age of Onset  . Diabetes Other    Scheduled Meds: . busPIRone  5 mg Oral Daily  . latanoprost  1 drop Both Eyes QHS  . lisinopril  10 mg Oral Daily  . QUEtiapine  25 mg Oral QHS  . rivaroxaban  20 mg Oral Daily  . simvastatin  40 mg Oral QHS   Continuous Infusions: . sodium chloride 50 mL/hr at 02/25/16 2316   PRN Meds:.acetaminophen, diclofenac sodium, morphine injection, traMADol Medications Prior to Admission:  Prior to Admission medications   Medication Sig Start Date End Date Taking? Authorizing Provider  acetaminophen (TYLENOL) 650 MG CR tablet Take 650 mg by mouth 3 (three) times daily.   Yes Historical Provider, MD  acetaminophen (TYLENOL) 650 MG suppository Place 650 mg rectally every 6 (six) hours as needed.   Yes Historical Provider, MD  busPIRone (BUSPAR) 5 MG tablet Take 5 mg by mouth daily.    Yes Historical Provider, MD  diclofenac sodium (VOLTAREN) 1 % GEL Apply 4 g topically 4 (four) times daily as needed. Apply to low back and affected area 02/13/16  Yes Merlyn Lot, MD  furosemide (LASIX) 20 MG tablet Take 20 mg by mouth daily.    Yes Historical Provider, MD  insulin glargine (LANTUS) 100 UNIT/ML injection Inject 20 Units into the skin daily.    Yes Historical Provider, MD  latanoprost (XALATAN) 0.005 % ophthalmic solution Place 1 drop into both eyes at bedtime.    Yes Historical Provider, MD  lisinopril (PRINIVIL,ZESTRIL) 10 MG tablet Take 10 mg by mouth daily.   Yes Historical Provider, MD  metFORMIN (GLUCOPHAGE) 500 MG tablet Take 500 mg by mouth 2 (two) times daily.    Yes Historical Provider, MD  omega-3 acid ethyl esters (LOVAZA) 1 g capsule Take 1 g by mouth daily.  Yes Historical Provider, MD  potassium chloride (K-DUR) 10 MEQ tablet Take 10 mEq by mouth daily.   Yes Historical Provider, MD  rivaroxaban (XARELTO) 20 MG TABS tablet Take 20 mg by mouth every morning.    Yes Historical Provider, MD  simvastatin (ZOCOR) 40 MG tablet Take 40 mg by mouth at bedtime.    Yes Historical Provider, MD  traMADol (ULTRAM) 50 MG tablet Take by mouth every 6 (six) hours as needed.   Yes Historical Provider, MD  HYDROcodone-acetaminophen (NORCO/VICODIN) 5-325 MG per tablet Take 0.5-1 tablets by mouth every 6 (six) hours as needed for moderate pain or severe pain. Patient taking differently: Take 1 tablet by mouth every 8 (eight) hours as needed for severe pain.  08/31/14   Lorin Picket, PA-C   Allergies  Allergen Reactions  . Sulfa Antibiotics    Review of Systems  Unable to perform ROS: Dementia   Physical Exam  Constitutional: She appears ill.  HENT:  Head: Normocephalic and atraumatic.  Cardiovascular: Regular rhythm.   Pulmonary/Chest: Effort normal and breath sounds normal.  Abdominal: Normal appearance.  Neurological: She is alert. She is disoriented.  Oriented to  person, pleasantly confused  Skin: Skin is warm and dry. There is pallor.  Psychiatric: Her speech is delayed. Cognition and memory are impaired. She is inattentive.  Nursing note and vitals reviewed.  Vital Signs: BP 132/72 (BP Location: Right Arm)   Pulse 69   Temp 98 F (36.7 C) (Oral)   Resp 16   Ht 5' 5.98" (1.676 m)   Wt 85.8 kg (189 lb 3.2 oz)   SpO2 97%   BMI 30.55 kg/m  Pain Assessment: PAINAD POSS *See Group Information*: S-Acceptable,Sleep, easy to arouse Pain Score: Asleep   SpO2: SpO2: 97 % O2 Device:SpO2: 97 % O2 Flow Rate: .O2 Flow Rate (L/min): 2 L/min  IO: Intake/output summary:   Intake/Output Summary (Last 24 hours) at 02/26/16 1209 Last data filed at 02/26/16 0900  Gross per 24 hour  Intake                0 ml  Output                0 ml  Net                0 ml    LBM:   Baseline Weight: Weight: 88.9 kg (196 lb) Most recent weight: Weight: 85.8 kg (189 lb 3.2 oz)     Palliative Assessment/Data: PPS 30%   Flowsheet Rows   Flowsheet Row Most Recent Value  Intake Tab  Referral Department  Hospitalist  Unit at Time of Referral  Orthopedic Unit  Palliative Care Primary Diagnosis  Neurology  Date Notified  02/25/16  Palliative Care Type  New Palliative care  Reason for referral  Clarify Goals of Care  Date of Admission  02/25/16  Date first seen by Palliative Care  02/26/16  # of days IP prior to Palliative referral  0  Clinical Assessment  Palliative Performance Scale Score  30%  Psychosocial & Spiritual Assessment  Palliative Care Outcomes  Patient/Family meeting held?  Yes  Who was at the meeting?  patient, daughter, and grandson  Palliative Care Outcomes  Clarified goals of care, ACP counseling assistance, Provided psychosocial or spiritual support, Linked to palliative care logitudinal support, Changed CPR status      Time In: 1030 Time Out: 1145 Time Total: 44mn Greater than 50%  of this time was spent counseling  and coordinating  care related to the above assessment and plan.  Signed by:  Ihor Dow, FNP-C Palliative Medicine Team  Phone: [phone] Fax: [phone]   Please contact Palliative Medicine Team phone at [phone] for questions and concerns.  For individual provider: See Shea Evans

## 2016-02-26 NOTE — Progress Notes (Signed)
PT Cancellation Note  Patient Details Name: Deborah Snyder MRN: [id number] DOB: Dec 24, 1922   Cancelled Treatment:    Reason Eval/Treat Not Completed: Other (comment).  PT consult received.  Chart reviewed.  Per neurosurgery note, pt with stable L2 compression fx and recommending bracing with TLSO.  Nursing reporting bracing has been ordered but not delivered yet.  Will re-attempt PT eval at a later date/time once bracing has been delivered.  Irving Burtonmily Jovian Lembcke 02/26/2016, 2:03 PM Hendricks LimesEmily Katlin Ciszewski, PT [phone]

## 2016-02-27 DIAGNOSIS — Z515 Encounter for palliative care: Secondary | ICD-10-CM | POA: Diagnosis not present

## 2016-02-27 DIAGNOSIS — S32020G Wedge compression fracture of second lumbar vertebra, subsequent encounter for fracture with delayed healing: Secondary | ICD-10-CM | POA: Diagnosis not present

## 2016-02-27 DIAGNOSIS — S32020A Wedge compression fracture of second lumbar vertebra, initial encounter for closed fracture: Secondary | ICD-10-CM | POA: Diagnosis not present

## 2016-02-27 DIAGNOSIS — Z7189 Other specified counseling: Secondary | ICD-10-CM | POA: Diagnosis not present

## 2016-02-27 DIAGNOSIS — F0391 Unspecified dementia with behavioral disturbance: Secondary | ICD-10-CM | POA: Diagnosis not present

## 2016-02-27 LAB — BASIC METABOLIC PANEL
Anion gap: 6 (ref 5–15)
BUN: 30 mg/dL — ABNORMAL HIGH (ref 6–20)
CO2: 27 mmol/L (ref 22–32)
CREATININE: 1.18 mg/dL — AB (ref 0.44–1.00)
Calcium: 8.6 mg/dL — ABNORMAL LOW (ref 8.9–10.3)
Chloride: 115 mmol/L — ABNORMAL HIGH (ref 101–111)
GFR, EST AFRICAN AMERICAN: 45 mL/min — AB (ref >60)
GFR, EST NON AFRICAN AMERICAN: 39 mL/min — AB (ref >60)
Glucose, Bld: 141 mg/dL — ABNORMAL HIGH (ref 65–99)
POTASSIUM: 4.5 mmol/L (ref 3.5–5.1)
SODIUM: 148 mmol/L — AB (ref 135–145)

## 2016-02-27 LAB — GLUCOSE, CAPILLARY
GLUCOSE-CAPILLARY: 127 mg/dL — AB (ref 65–99)
GLUCOSE-CAPILLARY: 166 mg/dL — AB (ref 65–99)
Glucose-Capillary: 198 mg/dL — ABNORMAL HIGH (ref 65–99)

## 2016-02-27 MED ORDER — MORPHINE SULFATE (CONCENTRATE) 10 MG/0.5ML PO SOLN: 5.0000 mg | ORAL | Status: DC | PRN

## 2016-02-27 MED ORDER — MORPHINE SULFATE (CONCENTRATE) 10 MG/0.5ML PO SOLN
5.0000 mg | ORAL | Status: DC | PRN
  Administered 2016-02-27 (×2): 5 mg via SUBLINGUAL
  Filled 2016-02-27 (×2): qty 1

## 2016-02-27 MED ORDER — INSULIN GLARGINE 100 UNIT/ML ~~LOC~~ SOLN: 10.0000 [IU] | Freq: Every day | SUBCUTANEOUS | 11 refills | Status: AC

## 2016-02-27 MED ORDER — DOCUSATE SODIUM 100 MG PO CAPS
200.0000 mg | ORAL_CAPSULE | Freq: Two times a day (BID) | ORAL | Status: DC
  Administered 2016-02-27: 200 mg via ORAL

## 2016-02-27 MED ORDER — MORPHINE SULFATE (CONCENTRATE) 10 MG/0.5ML PO SOLN: 5.0000 mg | ORAL | 0 refills | Status: AC | PRN

## 2016-02-27 MED ORDER — LORAZEPAM 0.5 MG PO TABS: 0.5000 mg | ORAL_TABLET | Freq: Three times a day (TID) | ORAL | 0 refills | Status: AC | PRN

## 2016-02-27 NOTE — Progress Notes (Signed)
Called report to Emmie NiemannShana Kirkman, RN at [facility]. Answered all questions. Called EMS for transport

## 2016-02-27 NOTE — Progress Notes (Signed)
Advance care planning  With patient's daughter at bedside. Palliative care present in the room. [facility] present. Patient wincing in pain with minimal movement. Unable to return to assisted living facility due to increased needs.  Discussed with daughter regarding patient's pain management and need for higher doses of pain medications which can make her drowsy and confused. She already has decreased oral intake causing dehydration. Explained patient's prolonged recovery and overall poor prognosis with ongoing dementia and need for pain medications. Daughter verbalized understanding. She is hoping patient will improve and be able to return to her baseline. I have explained that patient will need to go to long-term care with hospice following. Advised that we are hoping she will improve but need to transition to comfort care if there is worsening.  Daughter verbalized again that she would not want resuscitation or PEG tube  Time spent 20 min

## 2016-02-27 NOTE — Progress Notes (Signed)
Daily Progress Note   Patient Name: Deborah Snyder       Date: 02/27/2016 DOB: [birth year]  Age: 83 y.o. MRN#: [id number] Attending Physician: Milagros Loll, MD Primary Care Physician: No PCP Per Patient Admit Date: 02/25/2016  Reason for Consultation/Follow-up: Establishing goals of care  Subjective: Patient drowsy. She will occasionally nod head yes or no to questions. She screams out in pain with movement.   Daughter, Eber Jones at bedside. We discussed diagnoses, guarded prognosis with poor functional and nutritional status, and goals of care. Daughter wants her to be comfortable and not suffer. She remains slightly hopeful that she will continue to eat/drink and sit up on the side of the bed. She also seems very realistic and states "this fall may be the death of her." Eber Jones tells me her mother has been at peace with dying, often speaking of passing in her sleep.   Discussed transition to focus on comfort, quality, and dignity for the time she has left. Discussed hospice services, symptom management, and no escalation of care or re-hospitalization unless symptoms cannot be managed at facility. Again discussed the natural trajectory of dementia. Daughter is firm on not prolonging her mother's life with "tubes" knowing this would give her poor quality of life. She is open to hospice services on discharge.   Answered questions and concerns. Provided emotional and spiritual support.   Length of Stay: 0  Current Medications: Scheduled Meds:  . busPIRone  5 mg Oral Daily  . docusate sodium  200 mg Oral BID  . insulin aspart  0-9 Units Subcutaneous TID WC  . latanoprost  1 drop Both Eyes QHS  . lisinopril  10 mg Oral Daily  . QUEtiapine  25 mg Oral QHS  . rivaroxaban  15 mg Oral Daily     Continuous Infusions:   PRN Meds: acetaminophen, diclofenac sodium, HYDROcodone-acetaminophen, LORazepam, morphine injection, morphine CONCENTRATE  Physical Exam  Constitutional: She is easily aroused. She appears ill.  HENT:  Head: Normocephalic and atraumatic.  Cardiovascular: Regular rhythm and normal heart sounds.   Pulmonary/Chest: Effort normal. She has decreased breath sounds.  Abdominal: Normal appearance.  Musculoskeletal: She exhibits edema (generalized).  Neurological: She is easily aroused. She is disoriented.  Skin: Skin is warm and dry. There is pallor.  Psychiatric: Her speech is delayed. Cognition and memory  are impaired. She is inattentive.  Nursing note and vitals reviewed.          Vital Signs: BP (!) 119/91 (BP Location: Right Arm)   Pulse 72   Temp 98.1 F (36.7 C) (Oral)   Resp 12   Ht 5' 5.98" (1.676 m)   Wt 85.8 kg (189 lb 3.2 oz)   SpO2 96%   BMI 30.55 kg/m  SpO2: SpO2: 96 % O2 Device: O2 Device: Not Delivered O2 Flow Rate: O2 Flow Rate (L/min): 2 L/min  Intake/output summary:   Intake/Output Summary (Last 24 hours) at 02/27/16 1313 Last data filed at 02/27/16 1610  Gross per 24 hour  Intake                0 ml  Output                0 ml  Net                0 ml   LBM:   Baseline Weight: Weight: 88.9 kg (196 lb) Most recent weight: Weight: 85.8 kg (189 lb 3.2 oz)       Palliative Assessment/Data: PPS 30%   Flowsheet Rows   Flowsheet Row Most Recent Value  Intake Tab  Referral Department  Hospitalist  Unit at Time of Referral  Orthopedic Unit  Palliative Care Primary Diagnosis  Neurology  Date Notified  02/25/16  Palliative Care Type  New Palliative care  Reason for referral  Clarify Goals of Care  Date of Admission  02/25/16  Date first seen by Palliative Care  02/26/16  # of days IP prior to Palliative referral  0  Clinical Assessment  Palliative Performance Scale Score  30%  Psychosocial & Spiritual Assessment   Palliative Care Outcomes  Patient/Family meeting held?  Yes  Who was at the meeting?  daughter and patient  Palliative Care Outcomes  Clarified goals of care, Counseled regarding hospice, Changed to focus on comfort, ACP counseling assistance, Provided end of life care assistance, Provided psychosocial or spiritual support, Transitioned to hospice      Patient Active Problem List   Diagnosis Date Noted  . Dementia with behavioral disturbance   . Palliative care by specialist   . DNR (do not resuscitate)   . Goals of care, counseling/discussion   . Closed compression fracture of L2 lumbar vertebra (HCC) 02/25/2016  . Abdominal pain 02/25/2016  . Atrial fibrillation with rapid ventricular response (HCC) 08/26/2015    Palliative Care Assessment & Plan   Patient Profile: 83 y.o. female  with past medical history of dementia, hypertension, hyperlipidemia, diabetes, atrial fibrillation, and GERD admitted on 02/25/2016 with back pain and recent fall. CT scan lumbar spine reveals L2 compression fracture with 50% loss of height. Unable to obtain MRI. Patient also with severe abdominal pain-CT negative for acute event. Surgery has evaluated with no sign of acute abdomen or need for surgical intervention. TLSO brace and pain medication for fracture-no plans for kyphoplasty. Baseline dementia and patient now with poor oral intake. Palliative medicine consultation for goals of care.    Assessment: Dementia Compression fracture L2 Atrial fibrillation  Recommendations/Plan:  DNR/DNI  Focus is comfort and symptom management. Daughter firm with her decision on no resuscitation, life support, or feeding tube to keep her alive.  Roxanol 5mg  SL prn pain/dyspnea/air hunger.   Plan for nursing facility with hospice services. Discussed with social work.    Code Status: DNR/DNI   Code Status Orders  Start     Ordered   02/26/16 1135  Do not attempt resuscitation (DNR)  Continuous     Question Answer Comment  In the event of cardiac or respiratory ARREST Do not call a "code blue"   In the event of cardiac or respiratory ARREST Do not perform Intubation, CPR, defibrillation or ACLS   In the event of cardiac or respiratory ARREST Use medication by any route, position, wound care, and other measures to relive pain and suffering. May use oxygen, suction and manual treatment of airway obstruction as needed for comfort.      02/26/16 1134    Code Status History    Date Active Date Inactive Code Status Order ID Comments User Context   02/25/2016  7:24 PM 02/26/2016 11:34 AM Full Code [id number]  Alford Highlandichard Wieting, MD ED   08/26/2015  8:50 PM 08/27/2015  5:50 PM Full Code [id number]  Wyatt Hasteavid K Hower, MD ED   06/22/2015 10:44 PM 06/26/2015  4:57 PM Full Code [id number]  Altamese DillingVaibhavkumar Vachhani, MD Inpatient       Prognosis:   < 3 months guarded with severe functional and nutritional status decline after fall now with L2 compression fracture.   Discharge Planning:  Skilled Nursing Facility with Hospice  Care plan was discussed with patient, daughter, SW, and Dr. Elpidio AnisSudini  Thank you for allowing the Palliative Medicine Team to assist in the care of this patient.   Time In: 1130 Time Out: 1215 Total Time 45min Prolonged Time Billed  no       Greater than 50%  of this time was spent counseling and coordinating care related to the above assessment and plan.  Vennie HomansMegan Quinn Quam, FNP-C Palliative Medicine Team  Phone: [phone] Fax: [phone]  Please contact Palliative Medicine Team phone at [phone] for questions and concerns.

## 2016-02-27 NOTE — [place] FL2 (Signed)
[place] MEDICAID FL2 LEVEL OF CARE SCREENING TOOL     IDENTIFICATION  Patient Name: Deborah Snyder Birthdate: 05-21-1922 Sex: female Admission Date (Current Location): 02/25/2016  [facility] [place]  [place]  ([id number]) Facility and Address:  [facility], [address], [place], [place] [address]      Provider Number: [id number]  Attending Physician Name and Address:  Milagros LollSrikar Sudini, MD  Relative Name and Phone Number:       Current Level of Care: Hospital Recommended Level of Care: Skilled Nursing Facility  Prior Approval Number:    Date Approved/Denied:   PASRR Number:   Discharge Plan: Skilled Nursing Facility    Current Diagnoses: Patient Active Problem List   Diagnosis Date Noted  . Encounter for hospice care discussion   . Dementia with behavioral disturbance   . Palliative care by specialist   . DNR (do not resuscitate)   . Goals of care, counseling/discussion   . Closed compression fracture of L2 lumbar vertebra (HCC) 02/25/2016  . Abdominal pain 02/25/2016  . Atrial fibrillation with rapid ventricular response (HCC) 08/26/2015    Orientation RESPIRATION BLADDER Height & Weight     Self  Normal Incontinent Weight: 189 lb 3.2 oz (85.8 kg) Height:  5' 5.98" (167.6 cm)  BEHAVIORAL SYMPTOMS/MOOD NEUROLOGICAL BOWEL NUTRITION STATUS   (none)  (none) Continent DYS 1  AMBULATORY STATUS COMMUNICATION OF NEEDS Skin   Extensive Assist Verbally Normal                       Personal Care Assistance Level of Assistance  Bathing, Feeding, Dressing Bathing: Extensive Assist Feeding: Extensive Assist Dressing: Extensive Assist        Functional Limitations Info  Sight, Hearing, Speech Sight Info: Adequate Hearing Info: Adequate Speech Info: Adequate    SPECIAL CARE FACTORS FREQUENCY  PT (By licensed PT)  Will have [facility]    4-5 days per week                  Contractures       Additional Factors Info  Code Status, Allergies Code Status Info:  (Full Code. ) Allergies Info:  (Sulfa Antibiotics)           Current Medications (02/27/2016):  This is the current hospital active medication list Current Facility-Administered Medications  Medication Dose Route Frequency Provider Last Rate Last Dose  . acetaminophen (TYLENOL) suppository 650 mg  650 mg Rectal Q6H PRN Alford Highlandichard Wieting, MD      . busPIRone (BUSPAR) tablet 5 mg  5 mg Oral Daily Alford Highlandichard Wieting, MD   5 mg at 02/27/16 1038  . diclofenac sodium (VOLTAREN) 1 % transdermal gel 4 g  4 g Topical QID PRN Alford Highlandichard Wieting, MD      . docusate sodium (COLACE) capsule 200 mg  200 mg Oral BID Milagros LollSrikar Sudini, MD      . HYDROcodone-acetaminophen (NORCO) 10-325 MG per tablet 1 tablet  1 tablet Oral Q4H PRN Srikar Sudini, MD      . insulin aspart (novoLOG) injection 0-9 Units  0-9 Units Subcutaneous TID WC Milagros LollSrikar Sudini, MD   2 Units at 02/27/16 1143  . latanoprost (XALATAN) 0.005 % ophthalmic solution 1 drop  1 drop Both Eyes QHS Alford Highlandichard Wieting, MD   1 drop at 02/26/16 2132  . lisinopril (PRINIVIL,ZESTRIL) tablet 10 mg  10 mg Oral Daily Alford Highlandichard Wieting, MD   10 mg at 02/27/16 1038  . LORazepam (  ATIVAN) injection 0.5 mg  0.5 mg Intravenous Q6H PRN Srikar Sudini, MD      . morphine 2 MG/ML injection 1 mg  1 mg Intravenous Q3H PRN Alford Highland, MD   1 mg at 02/27/16 0857  . morphine CONCENTRATE 10 MG/0.5ML oral solution 5 mg  5 mg Sublingual Q2H PRN Alita Chyle, NP      . QUEtiapine (SEROQUEL) tablet 25 mg  25 mg Oral QHS Alford Highland, MD   25 mg at 02/26/16 2128  . Rivaroxaban (XARELTO) tablet 15 mg  15 mg Oral Daily Milagros Loll, MD   15 mg at 02/27/16 1038     Discharge Medications: Please see discharge summary for a list of discharge medications.  Relevant Imaging Results:  Relevant Lab Results:   Additional Information  (SSN: [id number])  Skye Plamondon, Darleen Crocker, LCSW

## 2016-02-27 NOTE — Discharge Summary (Addendum)
[facility] [place] [facility]   PATIENT NAME: Deborah Snyder    MR#:  [id number]  DATE OF BIRTH:  1922/09/08  DATE OF ADMISSION:  02/25/2016 ADMITTING PHYSICIAN: Alford Highlandichard Wieting, MD  DATE OF DISCHARGE: No discharge date for patient encounter.  PRIMARY CARE PHYSICIAN: No PCP Per Patient   ADMISSION DIAGNOSIS:  Back pain [M54.9] Closed compression fracture of L2 lumbar vertebra with delayed healing, subsequent encounter [S32.020G]  DISCHARGE DIAGNOSIS:  Active Problems:   Closed compression fracture of L2 lumbar vertebra (HCC)   Abdominal pain   Dementia with behavioral disturbance   Palliative care by specialist   DNR (do not resuscitate)   Goals of care, counseling/discussion   SECONDARY DIAGNOSIS:   Past Medical History:  Diagnosis Date  . Atrial fibrillation (HCC)   . Dementia   . Diabetes (HCC)   . Edema   . GERD (gastroesophageal reflux disease)   . Hyperlipidemia   . Hypertension      ADMITTING HISTORY  HISTORY OF PRESENT ILLNESS:  Deborah Snyder  is a 83 y.o. female with a known history of Dementia. [facility]. Patient unable to give me any history at the time of my evaluation. History obtained from old chart and daughter on the phone. Patient is been having some worsening back pain. Unable to get MRI of her back in the ER. A CT scan of the lumbar spine showed a compression fracture of L2 with 50% loss of height.  When I saw her earlier she was having severe abdominal pain to palpation which she is still having. CAT scan was negative for acute event. Case discussed with Dr. Excell Seltzerooper surgery who did not believe anything surgical was going on in the abdomen at this point.   HOSPITAL COURSE:   * Abdominal pain. CAT scan negative.  Regular diet Seen by surgery and not thought to be acute abdomen. Likely abdominal muscle pain.  * Compression fracture L2 Discussed with Neurosurgery yesterday TLSO brace Pain  control No plans for Kyphoplasty Roxanol added for pain  * History of atrial fibrillation on Xarelto  * Hyperlipidemia unspecified on Zocor  * Type 2 diabetes mellitus.   accuchecks AC and HS needed at rehab  * Dementia with behavioral disturbance  Presently plan is to have patient participate in physical therapy with the TLSO brace on an as needed pain medications. There is high probability patient with her decreased oral intake will get dehydrated. Unlikely to meet nutritional needs. Daughter understands this. Patient will have hospice following her at the nursing home. There is any worsening patient will need to be transitioned to comfort measures instead of bringing her to the emergency room.  CONSULTS OBTAINED:  Treatment Team:  Lucy ChrisSteven Cook, MD  DRUG ALLERGIES:   Allergies  Allergen Reactions  . Sulfa Antibiotics     DISCHARGE MEDICATIONS:   Current Discharge Medication List    START taking these medications   Details  LORazepam (ATIVAN) 0.5 MG tablet Take 1 tablet (0.5 mg total) by mouth every 8 (eight) hours as needed for anxiety. Qty: 15 tablet, Refills: 0    Morphine Sulfate (MORPHINE CONCENTRATE) 10 MG/0.5ML SOLN concentrated solution Place 0.25 mLs (5 mg total) under the tongue every hour as needed for severe pain or shortness of breath. Qty: 30 mL, Refills: 0      CONTINUE these medications which have CHANGED   Details  insulin glargine (LANTUS) 100 UNIT/ML injection Inject 0.1 mLs (10  Units total) into the skin daily. Qty: 10 mL, Refills: 11      CONTINUE these medications which have NOT CHANGED   Details  acetaminophen (TYLENOL) 650 MG CR tablet Take 650 mg by mouth 3 (three) times daily.    busPIRone (BUSPAR) 5 MG tablet Take 5 mg by mouth daily.    diclofenac sodium (VOLTAREN) 1 % GEL Apply 4 g topically 4 (four) times daily as needed. Apply to low back and affected area Qty: 100 g, Refills: 0    latanoprost (XALATAN) 0.005 % ophthalmic  solution Place 1 drop into both eyes at bedtime.     lisinopril (PRINIVIL,ZESTRIL) 10 MG tablet Take 10 mg by mouth daily.    metFORMIN (GLUCOPHAGE) 500 MG tablet Take 500 mg by mouth 2 (two) times daily.     omega-3 acid ethyl esters (LOVAZA) 1 g capsule Take 1 g by mouth daily.    potassium chloride (K-DUR) 10 MEQ tablet Take 10 mEq by mouth daily.    rivaroxaban (XARELTO) 20 MG TABS tablet Take 20 mg by mouth every morning.     simvastatin (ZOCOR) 40 MG tablet Take 40 mg by mouth at bedtime.       STOP taking these medications     acetaminophen (TYLENOL) 650 MG suppository      furosemide (LASIX) 20 MG tablet      traMADol (ULTRAM) 50 MG tablet      HYDROcodone-acetaminophen (NORCO/VICODIN) 5-325 MG per tablet         Today   VITAL SIGNS:  Blood pressure (!) 119/91, pulse 72, temperature 98.1 F (36.7 C), temperature source Oral, resp. rate 12, height 5' 5.98" (1.676 m), weight 85.8 kg (189 lb 3.2 oz), SpO2 96 %.  I/O:   Intake/Output Summary (Last 24 hours) at 02/27/16 1256 Last data filed at 02/27/16 1610  Gross per 24 hour  Intake                0 ml  Output                0 ml  Net                0 ml    PHYSICAL EXAMINATION:  Physical Exam  GENERAL:  83 y.o.-year-old patient lying in the bed with no acute distress.  LUNGS: Normal breath sounds bilaterally, no wheezing, rales,rhonchi or crepitation. No use of accessory muscles of respiration.  CARDIOVASCULAR: S1, S2 normal. No murmurs, rubs, or gallops.  ABDOMEN: Soft. Tender. NEUROLOGIC: Moves all 4 extremities. PSYCHIATRIC: The patient is alert and awake. Confused DATA REVIEW:   CBC  Recent Labs Lab 02/26/16 0407  WBC 9.5  HGB 11.0*  HCT 33.2*  PLT 219    Chemistries   Recent Labs Lab 02/27/16 0521  NA 148*  K 4.5  CL 115*  CO2 27  GLUCOSE 141*  BUN 30*  CREATININE 1.18*  CALCIUM 8.6*    Cardiac Enzymes No results for input(s): TROPONINI in the last 168 hours.  Microbiology  Results  Results for orders placed or performed during the hospital encounter of 02/25/16  Urine culture     Status: Abnormal (Preliminary result)   Collection Time: 02/25/16  2:37 PM  Result Value Ref Range Status   Specimen Description URINE, RANDOM  Final   Special Requests NONE  Final   Culture (A)  Final    60,000 COLONIES/mL ESCHERICHIA COLI SUSCEPTIBILITIES TO FOLLOW Performed at [facility], [address] [place], [place] [address]    Report Status PENDING  Incomplete  MRSA PCR Screening     Status: None   Collection Time: 02/26/16  9:26 PM  Result Value Ref Range Status   MRSA by PCR NEGATIVE NEGATIVE Final    Comment:        The GeneXpert MRSA Assay (FDA approved for NASAL specimens only), is one component of a comprehensive MRSA colonization surveillance program. It is not intended to diagnose MRSA infection nor to guide or monitor treatment for MRSA infections.     RADIOLOGY:  Ct Abdomen Pelvis Wo Contrast  Result Date: 02/25/2016 CLINICAL DATA:  Compression fracture of lumbar spine. Addendum distention with acute tenderness and guarding. EXAM: CT ABDOMEN AND PELVIS WITHOUT CONTRAST TECHNIQUE: Multidetector CT imaging of the abdomen and pelvis was performed following the standard protocol without IV contrast. COMPARISON:  Same day thoracolumbar spine CT FINDINGS: Lower chest: Cardiomegaly with coronary arteriosclerosis. No pericardial effusion. Mild subpleural areas of atelectasis and/or fibrosis. Faint ground-glass opacities may be secondary to hypoventilatory change at each lung base. Hepatobiliary: The unenhanced liver is unremarkable. No biliary dilatation. The patient is status post cholecystectomy. Pancreas: No pancreatic mass or ductal dilatation. Mild fatty atrophy. Spleen: No splenomegaly or mass. Adrenals/Urinary Tract: Mild thickening of left adrenal apex. No adrenal nodule or mass. No nephrolithiasis nor obstructive uropathy. There appear to be  interpolar cysts off the right kidney measuring 1.5 cm anteriorly and 0.8 cm posteriorly. Stomach/Bowel: The stomach is contracted. There is normal small bowel rotation. No small bowel dilatation. There is extensive diverticulosis distal transverse colon sigmoid. No acute inflammation. No large bowel dilatation. Numerous sutures noted in the right hemiabdomen. Appendix not visualized. The appendix may be surgically absent. Vascular/Lymphatic: Aortic and branch vessel atherosclerosis. Bilateral renal artery stents at the origins. No aneurysm no adenopathy by CT criteria. Reproductive: Hysterectomy.  No adnexal mass. Other: Small fat containing umbilical hernia. No free air nor free fluid. Musculoskeletal: 2 superior endplate compression fracture with 50% height loss and slight retropulsion of posterosuperior aspect. Degenerative disc space narrowing L5-S1 with multilevel facet arthropathy. IMPRESSION: 1. Compression fracture of L2 with slight retropulsion but without significant canal stenosis. 2. No acute intra-abdominal or pelvic solid nor hollow visceral organ abnormality. 3. Extensive colonic diverticulosis without acute diverticulitis. No bowel obstruction or perforation. 4. Aortic and branch vessel atherosclerosis. Bilateral renal artery stents. 5. Right-sided interpolar renal cysts the largest 1.5 cm and simple in appearance. 6. Cholecystectomy. 7. Appendix not visualized and may be surgically absent. No pericecal inflammation. 8. Cardiomegaly with coronary arteriosclerosis. Electronically Signed   By: Tollie Eth M.D.   On: 02/25/2016 18:37   Dg Lumbar Spine 2-3 Views  Result Date: 02/26/2016 CLINICAL DATA:  Lumbar fractured EXAM: LUMBAR SPINE - 2-3 VIEW COMPARISON:  CT from 02/25/2016 FINDINGS: The patient has a known superior endplate compression fracture of L2 which appears stable relative to the CT reformatted images of the lumbar spine from 1 day prior. No significant change in posterosuperior  minimal retropulsion. No new fracture is identified. There is no spondylolysis nor spondylolisthesis. There is degenerative disc disease with disc space narrowing at L5-S1. There is aortic atherosclerosis without aneurysm. IMPRESSION: 50% compression of the superior endplate of L2 is again noted with minimal retropulsion of the posterosuperior aspect of the vertebral body. No further height loss identified. Electronically Signed   By: Tollie Eth M.D.   On: 02/26/2016 20:46   Ct Thoracic Spine Wo Contrast  Result Date: 02/25/2016 CLINICAL  DATA:  83 year old female post fall with compression fracture. Subsequent encounter. EXAM: CT THORACIC AND LUMBAR SPINE WITHOUT CONTRAST TECHNIQUE: Multidetector CT imaging of the thoracic and lumbar spine was performed without contrast. Multiplanar CT image reconstructions were also generated. COMPARISON:  02/13/2016 plain film exam. 02/28/2012 CT abdomen and pelvis. 08/02/1998 and chest CT. FINDINGS: CT THORACIC SPINE FINDINGS Alignment: Mild kyphosis. Vertebrae: No thoracic spine compression fracture. Paraspinal and other soft tissues: Small hiatal hernia. Mild cardiomegaly. Trace pericardial fluid. Prominent coronary artery calcifications. Atherosclerotic changes thoracic aorta. Motion degradation without worrisome lung lesion. Disc levels: Scattered minimal degenerative changes without significant spinal stenosis. CT LUMBAR SPINE FINDINGS Segmentation:  Last fully open disc space L5-S1. Alignment: Scoliosis convex left. Vertebrae: L2 superior endplate comminuted compression fracture with 50% loss height. Mild retropulsion posterosuperior aspect contributes to mild ventral thecal sac narrowing without high-grade spinal stenosis. No other fracture noted. Paraspinal and other soft tissues: Stable right renal 1.2 cm cyst. Atherosclerotic changes aorta and common iliac arteries with ectasia. Post renal artery stenting. Disc levels: L1-2:  Minimal bulge. L2-3: Mild bulge. Mild  facet degenerative changes and ligamentum flavum hypertrophy. Mild spinal stenosis. L3-4: Disc degeneration with disc space narrowing greater on the right. Mild bulge. Ligamentum flavum hypertrophy. Facet degenerative changes. Mild to slightly moderate spinal stenosis. L4-5: Facet degenerative changes. Bulge. Minimal anterior slip L4. Mild spinal stenosis. Mild right lateral recess narrowing. L5-S1: Mild disc degeneration with minimal bulge and spur. IMPRESSION: CT THORACIC SPINE IMPRESSION No thoracic compression fracture. CT LUMBAR SPINE IMPRESSION L2 superior endplate comminuted compression fracture with 50% loss height. Mild retropulsion posterosuperior aspect contributes to mild ventral thecal sac narrowing without high-grade spinal stenosis. Degenerative changes otherwise as detailed above. Aortic atherosclerosis. Electronically Signed   By: Lacy Duverney M.D.   On: 02/25/2016 16:45   Ct Lumbar Spine Wo Contrast  Result Date: 02/25/2016 CLINICAL DATA:  83 year old female post fall with compression fracture. Subsequent encounter. EXAM: CT THORACIC AND LUMBAR SPINE WITHOUT CONTRAST TECHNIQUE: Multidetector CT imaging of the thoracic and lumbar spine was performed without contrast. Multiplanar CT image reconstructions were also generated. COMPARISON:  02/13/2016 plain film exam. 02/28/2012 CT abdomen and pelvis. 08/02/1998 and chest CT. FINDINGS: CT THORACIC SPINE FINDINGS Alignment: Mild kyphosis. Vertebrae: No thoracic spine compression fracture. Paraspinal and other soft tissues: Small hiatal hernia. Mild cardiomegaly. Trace pericardial fluid. Prominent coronary artery calcifications. Atherosclerotic changes thoracic aorta. Motion degradation without worrisome lung lesion. Disc levels: Scattered minimal degenerative changes without significant spinal stenosis. CT LUMBAR SPINE FINDINGS Segmentation:  Last fully open disc space L5-S1. Alignment: Scoliosis convex left. Vertebrae: L2 superior endplate  comminuted compression fracture with 50% loss height. Mild retropulsion posterosuperior aspect contributes to mild ventral thecal sac narrowing without high-grade spinal stenosis. No other fracture noted. Paraspinal and other soft tissues: Stable right renal 1.2 cm cyst. Atherosclerotic changes aorta and common iliac arteries with ectasia. Post renal artery stenting. Disc levels: L1-2:  Minimal bulge. L2-3: Mild bulge. Mild facet degenerative changes and ligamentum flavum hypertrophy. Mild spinal stenosis. L3-4: Disc degeneration with disc space narrowing greater on the right. Mild bulge. Ligamentum flavum hypertrophy. Facet degenerative changes. Mild to slightly moderate spinal stenosis. L4-5: Facet degenerative changes. Bulge. Minimal anterior slip L4. Mild spinal stenosis. Mild right lateral recess narrowing. L5-S1: Mild disc degeneration with minimal bulge and spur. IMPRESSION: CT THORACIC SPINE IMPRESSION No thoracic compression fracture. CT LUMBAR SPINE IMPRESSION L2 superior endplate comminuted compression fracture with 50% loss height. Mild retropulsion posterosuperior aspect contributes to mild ventral thecal  sac narrowing without high-grade spinal stenosis. Degenerative changes otherwise as detailed above. Aortic atherosclerosis. Electronically Signed   By: Lacy Duverney M.D.   On: 02/25/2016 16:45    Follow up with PCP in 1 week.  Management plans discussed with the patient, family and they are in agreement.  CODE STATUS:     Code Status Orders        Start     Ordered   02/26/16 1135  Do not attempt resuscitation (DNR)  Continuous    Question Answer Comment  In the event of cardiac or respiratory ARREST Do not call a "code blue"   In the event of cardiac or respiratory ARREST Do not perform Intubation, CPR, defibrillation or ACLS   In the event of cardiac or respiratory ARREST Use medication by any route, position, wound care, and other measures to relive pain and suffering. May use  oxygen, suction and manual treatment of airway obstruction as needed for comfort.      02/26/16 1134    Code Status History    Date Active Date Inactive Code Status Order ID Comments User Context   02/25/2016  7:24 PM 02/26/2016 11:34 AM Full Code [id number]  Alford Highland, MD ED   08/26/2015  8:50 PM 08/27/2015  5:50 PM Full Code [id number]  Wyatt Haste, MD ED   06/22/2015 10:44 PM 06/26/2015  4:57 PM Full Code [id number]  Altamese Dilling, MD Inpatient      TOTAL TIME TAKING CARE OF THIS PATIENT ON DAY OF DISCHARGE: more than 30 minutes.   Milagros Loll R M.D on 02/27/2016 at 12:56 PM  Between 7am to 6pm - Pager - [phone]  After 6pm go to [email or web address] - password [email or web address] [facility]  [facility] [place] [facility]  Office  [phone]  CC: Primary care physician; No PCP Per Patient  Note: This dictation was prepared with Dragon dictation along with smaller phrase technology. Any transcriptional errors that result from this process are unintentional.

## 2016-02-27 NOTE — Progress Notes (Signed)
Per [facility] resident care coordinator she is coming to assess patient today between 25 and 12 to see if they can take her back. Clinical Social Worker (CSW) met with patient's daughter Hoyle Sauer at bedside and made her aware of above. Per daughter if patient can't return to [facility] she would like for patient to go to [facility]. CSW explained that patient would have to be placed under medicaid because she is observation. CSW explained the barriers to medicaid beds. Daughter reported that patient went to the [employer] and she will call Max the administer at [employer]. CSW sent SNF referral and contacted [facility] admissions coordinator at [facility] and ask him to review referral. CSW will continue to follow and assist as needed.   [employer], LCSW [phone]

## 2016-02-27 NOTE — Progress Notes (Signed)
New referral [facility] at [facility] received from [place]. Patient is a 83 year old woman with a PMH of dementia, DMII, HTN, HLD, A-fib and GERD,  admitted from [facility] for evaluation of severe back pain following a fall. In the ED a CT scan of the lumbar spine showed a compression fracture of L2 with 50% loss of height.  She also had complaints of severe abdominal pain, abdominal CT has ruled out any acute process. She has had little oral intake since admission and Palliative Medicine was consulted for goals of care. Patient's daughter Hoyle Sauer met with Palliative NP Ihor Dow and has chosen to have her mother discharge with hospice services to follow at [facility] where she will be a long germ care resident. Writer met in the room with Hoyle Sauer, patient was alert at times and did respond to her name. Writer initiated education regarding hospice services, philosophy and team approach with good understanding voiced by Hoyle Sauer. Discussed pain control vs alertness and difficulty with eating , Hoyle Sauer voiced her understanding. Patient is now in a TLSO brace and can only sit up slightly.[name] is hopeful that the brace will help improve her mother's pain and that she will be able to eat more, but she also expressed her feelings that "her mother doesn't have a lot of time left". Writer provided emotional support. Hospice information and contact number given to [facility]. Patient information faxed to hospice referral. Patient will transport by EMS, signed DNR in place. Thank you for the opportunity to be involved in the care of this patient and her family. Flo Shanks RN, BSN, [facility] [address], hospital liaison [phone] c

## 2016-02-27 NOTE — Progress Notes (Signed)
[facility] will not accept patient back. Per [facility] admissions coordinator at [place] they will accept patient under long term care medicaid. Clinical Social Worker (CSW) met with patient's daughter Hoyle Sauer at bedside and made her aware of above. Daughter has accepted bed at [facility]. CSW also received consult to provide hospice choice. Daughter is agreeable with hospice and chose [facility]. [facility] hospice liaison is aware of above. [facility] is pending.   [employer], LCSW [phone]

## 2016-02-27 NOTE — Evaluation (Signed)
Clinical/Bedside Swallow Evaluation Patient Details  Name: Deborah Snyder MRN: [id number] Date of Birth: [birth year]  Today's Date: 02/27/2016 Time: SLP Start Time (ACUTE ONLY): 0945 SLP Stop Time (ACUTE ONLY): 1045 SLP Time Calculation (min) (ACUTE ONLY): 60 min  Past Medical History:  Past Medical History:  Diagnosis Date  . Atrial fibrillation (HCC)   . Dementia   . Diabetes (HCC)   . Edema   . GERD (gastroesophageal reflux disease)   . Hyperlipidemia   . Hypertension    Past Surgical History:  Past Surgical History:  Procedure Laterality Date  . NO PAST SURGERIES     HPI:      Assessment / Plan / Recommendation Clinical Impression  Pt appears at increased risk for aspiration secondary to her declined Cognitive status(dx of Dementia) and her Closed compression fracture of L2 lumbar vertebra with delayed healing causing increased pain and discomfort; pt is also wearing a back brace restricting her movements and hampering upright positioning. Pt is exhibiting oral phase dysphagia c/b increase time and reduce awareness of bolus material. Oral phase deficits and the Cognitive/positioning challenges can significantly increase risk for aspiration to occur.     Aspiration Risk  Moderate aspiration risk    Diet Recommendation  Dysphagia level 1 w/ Nectar consistency liquids via pinched straw if needed; strict aspiration precautions; assistance w/ upright positioning to ensure most comfort; feeding support at meals. F/u by Dietician for nutritional supplements.  Medication Administration: Crushed with puree    Other  Recommendations Recommended Consults:  (Dietician f/u) Oral Care Recommendations: Oral care BID;Staff/trained caregiver to provide oral care Other Recommendations: Order thickener from pharmacy;Prohibited food (jello, ice cream, thin soups);Remove water pitcher;Have oral suction available   Follow up Recommendations Skilled Nursing facility (tbd)      Frequency and  Duration min 3x week  2 weeks       Prognosis Prognosis for Safe Diet Advancement: Fair Barriers to Reach Goals: Cognitive deficits;Severity of deficits (pain)      Swallow Study   General Date of Onset: 02/25/16 Type of Study: Bedside Swallow Evaluation Previous Swallow Assessment: none indicated Diet Prior to this Study: Regular;Thin liquids (per report) Temperature Spikes Noted: No (wbc 9.5 down from 14.3) Respiratory Status: Room air History of Recent Intubation: No Behavior/Cognition: Alert;Cooperative;Confused;Distractible;Requires cueing (in discomfort from back pain) Oral Cavity Assessment: Dry Oral Care Completed by SLP: Recent completion by staff Oral Cavity - Dentition: Adequate natural dentition;Missing dentition Vision:  (n/a) Self-Feeding Abilities: Total assist Patient Positioning: Postural control adequate for testing (challenged by back pain) Baseline Vocal Quality: Normal Volitional Cough: Cognitively unable to elicit Volitional Swallow: Unable to elicit    Oral/Motor/Sensory Function Overall Oral Motor/Sensory Function: Within functional limits (w/ bolus management)   Ice Chips Ice chips: Impaired Presentation: Spoon (fed; 2 trials) Oral Phase Impairments: Poor awareness of bolus;Reduced lingual movement/coordination Oral Phase Functional Implications: Oral holding Pharyngeal Phase Impairments:  (none)   Thin Liquid Thin Liquid: Not tested    Nectar Thick Nectar Thick Liquid: Impaired Presentation: Spoon (pinched straw delivery; 8 trials) Oral Phase Impairments: Reduced labial seal;Poor awareness of bolus Oral phase functional implications: Oral holding (min) Pharyngeal Phase Impairments:  (none) Other Comments: poor awareness to use a straw to drink; open mouth posture   Honey Thick Honey Thick Liquid: Not tested   Puree Puree: Impaired Presentation: Spoon (fed; 10 trials) Oral Phase Impairments: Reduced labial seal;Poor awareness of bolus Oral Phase  Functional Implications:  (increased oral phase time) Pharyngeal Phase Impairments:  (none)  Other Comments: frequent verbal/model cues given w/ trials   Solid   GO   Solid: Not tested    Functional Assessment Tool Used: clinical judgement Functional Limitations: Swallowing Swallow Current Status ([id number](G8996): At least 20 percent but less than 40 percent impaired, limited or restricted Swallow Goal Status [phone](G8997): At least 20 percent but less than 40 percent impaired, limited or restricted Swallow Discharge Status [phone](G8998): At least 20 percent but less than 40 percent impaired, limited or restricted     Jerilynn SomKatherine Watson, MS, CCC-SLP Watson,Katherine 02/27/2016,2:42 PM

## 2016-02-27 NOTE — Progress Notes (Signed)
[facility] [place] [facility]   PATIENT NAME: Deborah Snyder    MR#:  [id number]  DATE OF BIRTH:  10/16/1922  SUBJECTIVE:  CHIEF COMPLAINT:   Chief Complaint  Patient presents with  . Back Pain   Non verbal.  Distressed due to Pain with minimal movement  REVIEW OF SYSTEMS:    Review of Systems  Unable to perform ROS: Dementia    DRUG ALLERGIES:   Allergies  Allergen Reactions  . Sulfa Antibiotics     VITALS:  Blood pressure (!) 119/91, pulse 72, temperature 98.1 F (36.7 C), temperature source Oral, resp. rate 12, height 5' 5.98" (1.676 m), weight 85.8 kg (189 lb 3.2 oz), SpO2 96 %.  PHYSICAL EXAMINATION:   Physical Exam  GENERAL:  83 y.o.-year-old patient lying in the bed.  HEENT: Head atraumatic, normocephalic. Oropharynx and nasopharynx clear.  LUNGS: Normal breath sounds bilaterally, no wheezing, rales, rhonchi. No use of accessory muscles of respiration.  CARDIOVASCULAR: S1, S2 normal. No murmurs, rubs, or gallops.  ABDOMEN: Soft, nondistended. Bowel sounds present. No organomegaly or mass. Tender diffusely EXTREMITIES: No cyanosis, clubbing or edema b/l.    NEUROLOGIC: Moves all 4 extremities PSYCHIATRIC: The patient is restless, confused  LABORATORY PANEL:   CBC  Recent Labs Lab 02/26/16 0407  WBC 9.5  HGB 11.0*  HCT 33.2*  PLT 219   ------------------------------------------------------------------------------------------------------------------ Chemistries   Recent Labs Lab 02/27/16 0521  NA 148*  K 4.5  CL 115*  CO2 27  GLUCOSE 141*  BUN 30*  CREATININE 1.18*  CALCIUM 8.6*   ------------------------------------------------------------------------------------------------------------------  Cardiac Enzymes No results for input(s): TROPONINI in the last 168 hours. ------------------------------------------------------------------------------------------------------------------  RADIOLOGY:  Ct Abdomen Pelvis Wo  Contrast  Result Date: 02/25/2016 CLINICAL DATA:  Compression fracture of lumbar spine. Addendum distention with acute tenderness and guarding. EXAM: CT ABDOMEN AND PELVIS WITHOUT CONTRAST TECHNIQUE: Multidetector CT imaging of the abdomen and pelvis was performed following the standard protocol without IV contrast. COMPARISON:  Same day thoracolumbar spine CT FINDINGS: Lower chest: Cardiomegaly with coronary arteriosclerosis. No pericardial effusion. Mild subpleural areas of atelectasis and/or fibrosis. Faint ground-glass opacities may be secondary to hypoventilatory change at each lung base. Hepatobiliary: The unenhanced liver is unremarkable. No biliary dilatation. The patient is status post cholecystectomy. Pancreas: No pancreatic mass or ductal dilatation. Mild fatty atrophy. Spleen: No splenomegaly or mass. Adrenals/Urinary Tract: Mild thickening of left adrenal apex. No adrenal nodule or mass. No nephrolithiasis nor obstructive uropathy. There appear to be interpolar cysts off the right kidney measuring 1.5 cm anteriorly and 0.8 cm posteriorly. Stomach/Bowel: The stomach is contracted. There is normal small bowel rotation. No small bowel dilatation. There is extensive diverticulosis distal transverse colon sigmoid. No acute inflammation. No large bowel dilatation. Numerous sutures noted in the right hemiabdomen. Appendix not visualized. The appendix may be surgically absent. Vascular/Lymphatic: Aortic and branch vessel atherosclerosis. Bilateral renal artery stents at the origins. No aneurysm no adenopathy by CT criteria. Reproductive: Hysterectomy.  No adnexal mass. Other: Small fat containing umbilical hernia. No free air nor free fluid. Musculoskeletal: 2 superior endplate compression fracture with 50% height loss and slight retropulsion of posterosuperior aspect. Degenerative disc space narrowing L5-S1 with multilevel facet arthropathy. IMPRESSION: 1. Compression fracture of L2 with slight retropulsion  but without significant canal stenosis. 2. No acute intra-abdominal or pelvic solid nor hollow visceral organ abnormality. 3. Extensive colonic diverticulosis without acute diverticulitis. No bowel obstruction or perforation. 4. Aortic and branch vessel atherosclerosis. Bilateral renal artery  stents. 5. Right-sided interpolar renal cysts the largest 1.5 cm and simple in appearance. 6. Cholecystectomy. 7. Appendix not visualized and may be surgically absent. No pericecal inflammation. 8. Cardiomegaly with coronary arteriosclerosis. Electronically Signed   By: Tollie Ethavid  Kwon M.D.   On: 02/25/2016 18:37   Dg Lumbar Spine 2-3 Views  Result Date: 02/26/2016 CLINICAL DATA:  Lumbar fractured EXAM: LUMBAR SPINE - 2-3 VIEW COMPARISON:  CT from 02/25/2016 FINDINGS: The patient has a known superior endplate compression fracture of L2 which appears stable relative to the CT reformatted images of the lumbar spine from 1 day prior. No significant change in posterosuperior minimal retropulsion. No new fracture is identified. There is no spondylolysis nor spondylolisthesis. There is degenerative disc disease with disc space narrowing at L5-S1. There is aortic atherosclerosis without aneurysm. IMPRESSION: 50% compression of the superior endplate of L2 is again noted with minimal retropulsion of the posterosuperior aspect of the vertebral body. No further height loss identified. Electronically Signed   By: Tollie Ethavid  Kwon M.D.   On: 02/26/2016 20:46   Ct Thoracic Spine Wo Contrast  Result Date: 02/25/2016 CLINICAL DATA:  83 year old female post fall with compression fracture. Subsequent encounter. EXAM: CT THORACIC AND LUMBAR SPINE WITHOUT CONTRAST TECHNIQUE: Multidetector CT imaging of the thoracic and lumbar spine was performed without contrast. Multiplanar CT image reconstructions were also generated. COMPARISON:  02/13/2016 plain film exam. 02/28/2012 CT abdomen and pelvis. 08/02/1998 and chest CT. FINDINGS: CT THORACIC SPINE  FINDINGS Alignment: Mild kyphosis. Vertebrae: No thoracic spine compression fracture. Paraspinal and other soft tissues: Small hiatal hernia. Mild cardiomegaly. Trace pericardial fluid. Prominent coronary artery calcifications. Atherosclerotic changes thoracic aorta. Motion degradation without worrisome lung lesion. Disc levels: Scattered minimal degenerative changes without significant spinal stenosis. CT LUMBAR SPINE FINDINGS Segmentation:  Last fully open disc space L5-S1. Alignment: Scoliosis convex left. Vertebrae: L2 superior endplate comminuted compression fracture with 50% loss height. Mild retropulsion posterosuperior aspect contributes to mild ventral thecal sac narrowing without high-grade spinal stenosis. No other fracture noted. Paraspinal and other soft tissues: Stable right renal 1.2 cm cyst. Atherosclerotic changes aorta and common iliac arteries with ectasia. Post renal artery stenting. Disc levels: L1-2:  Minimal bulge. L2-3: Mild bulge. Mild facet degenerative changes and ligamentum flavum hypertrophy. Mild spinal stenosis. L3-4: Disc degeneration with disc space narrowing greater on the right. Mild bulge. Ligamentum flavum hypertrophy. Facet degenerative changes. Mild to slightly moderate spinal stenosis. L4-5: Facet degenerative changes. Bulge. Minimal anterior slip L4. Mild spinal stenosis. Mild right lateral recess narrowing. L5-S1: Mild disc degeneration with minimal bulge and spur. IMPRESSION: CT THORACIC SPINE IMPRESSION No thoracic compression fracture. CT LUMBAR SPINE IMPRESSION L2 superior endplate comminuted compression fracture with 50% loss height. Mild retropulsion posterosuperior aspect contributes to mild ventral thecal sac narrowing without high-grade spinal stenosis. Degenerative changes otherwise as detailed above. Aortic atherosclerosis. Electronically Signed   By: Lacy DuverneySteven  Olson M.D.   On: 02/25/2016 16:45   Ct Lumbar Spine Wo Contrast  Result Date: 02/25/2016 CLINICAL  DATA:  83 year old female post fall with compression fracture. Subsequent encounter. EXAM: CT THORACIC AND LUMBAR SPINE WITHOUT CONTRAST TECHNIQUE: Multidetector CT imaging of the thoracic and lumbar spine was performed without contrast. Multiplanar CT image reconstructions were also generated. COMPARISON:  02/13/2016 plain film exam. 02/28/2012 CT abdomen and pelvis. 08/02/1998 and chest CT. FINDINGS: CT THORACIC SPINE FINDINGS Alignment: Mild kyphosis. Vertebrae: No thoracic spine compression fracture. Paraspinal and other soft tissues: Small hiatal hernia. Mild cardiomegaly. Trace pericardial fluid. Prominent coronary artery calcifications. Atherosclerotic  changes thoracic aorta. Motion degradation without worrisome lung lesion. Disc levels: Scattered minimal degenerative changes without significant spinal stenosis. CT LUMBAR SPINE FINDINGS Segmentation:  Last fully open disc space L5-S1. Alignment: Scoliosis convex left. Vertebrae: L2 superior endplate comminuted compression fracture with 50% loss height. Mild retropulsion posterosuperior aspect contributes to mild ventral thecal sac narrowing without high-grade spinal stenosis. No other fracture noted. Paraspinal and other soft tissues: Stable right renal 1.2 cm cyst. Atherosclerotic changes aorta and common iliac arteries with ectasia. Post renal artery stenting. Disc levels: L1-2:  Minimal bulge. L2-3: Mild bulge. Mild facet degenerative changes and ligamentum flavum hypertrophy. Mild spinal stenosis. L3-4: Disc degeneration with disc space narrowing greater on the right. Mild bulge. Ligamentum flavum hypertrophy. Facet degenerative changes. Mild to slightly moderate spinal stenosis. L4-5: Facet degenerative changes. Bulge. Minimal anterior slip L4. Mild spinal stenosis. Mild right lateral recess narrowing. L5-S1: Mild disc degeneration with minimal bulge and spur. IMPRESSION: CT THORACIC SPINE IMPRESSION No thoracic compression fracture. CT LUMBAR SPINE  IMPRESSION L2 superior endplate comminuted compression fracture with 50% loss height. Mild retropulsion posterosuperior aspect contributes to mild ventral thecal sac narrowing without high-grade spinal stenosis. Degenerative changes otherwise as detailed above. Aortic atherosclerosis. Electronically Signed   By: Lacy Duverney M.D.   On: 02/25/2016 16:45   ASSESSMENT AND PLAN:   1.  Abdominal pain. CAT scan negative.  Regular diet Seen by surgery and not thought to be acute abdomen. Likely abdominal muscle pain.  2. Compression fracture L2 Discussed with Neurosurgery yesterday TLSO brace Pain control No plans for Kyphoplasty Roxanol added for pain  3. History of atrial fibrillation on Xarelto  4. Hyperlipidemia unspecified on Zocor  5. Type 2 diabetes mellitus. Sliding scale insulin  6. Dementia with behavioral disturbance When necessary Haldol during the day  All the records are reviewed and case discussed with Care Management/Social Workerr. Management plans discussed with the patient, family and they are in agreement.  CODE STATUS: DNR  DVT Prophylaxis: SCDs  TOTAL TIME TAKING CARE OF THIS PATIENT: 25 minutes.   POSSIBLE D/C IN 1-2 DAYS, DEPENDING ON CLINICAL CONDITION.  Milagros Loll R M.D on 02/27/2016 at 12:52 PM  Between 7am to 6pm - Pager - [phone]  After 6pm go to [email or web address] - password [email or web address] [facility]  [facility]  Office  [phone]  CC: Primary care physician; No PCP Per Patient  Note: This dictation was prepared with Dragon dictation along with smaller phrase technology. Any transcriptional errors that result from this process are unintentional.

## 2016-02-27 NOTE — Clinical Social Work Placement (Signed)
   CLINICAL SOCIAL WORK PLACEMENT  NOTE  Date:  02/27/2016  Patient Details  Name: Deborah Snyder MRN: [id number] Date of Birth: 1922-10-17  Clinical Social Work is seeking post-discharge placement for this patient at the Skilled  Nursing Facility level of care (*CSW will initial, date and re-position this form in  chart as items are completed):  Yes   Patient/family provided with [facility] Clinical Social Work Department's list of facilities offering this level of care within the geographic area requested by the patient (or if unable, by the patient's family).  Yes   Patient/family informed of their freedom to choose among providers that offer the needed level of care, that participate in Medicare, Medicaid or managed care program needed by the patient, have an available bed and are willing to accept the patient.  Yes   Patient/family informed of [facility]'s ownership interest [facility] and [facility], as well as of the fact that they are under no obligation to receive care at these facilities.  PASRR submitted to EDS on 02/27/16     PASRR number received on 02/27/16     Existing PASRR number confirmed on       FL2 transmitted to all facilities in geographic area requested by pt/family on 02/27/16     FL2 transmitted to all facilities within larger geographic area on       Patient informed that his/her managed care company has contracts with or will negotiate with certain facilities, including the following:        Yes   Patient/family informed of bed offers received.  Patient chooses bed at  [facility]([place] )     Physician recommends and patient chooses bed at      Patient to be transferred to  [facility]([place] ) on 02/27/16.  Patient to be transferred to facility by  [facility] )     Patient family notified on 02/27/16 of transfer.  Name of family member notified:   (Patient's daughter Eber JonesCarolyn is at bedside and aware of D/C today. )     PHYSICIAN        Additional Comment:    _______________________________________________ Sarann Tregre, Darleen CrockerBailey M, LCSW 02/27/2016, 3:37 PM

## 2016-02-27 NOTE — Progress Notes (Addendum)
Patient is medically stable for D/C to [facility] today. [facility] will [facility] to room C-4, bed 1. RN will call report and arrange EMS for transport. Clinical Child psychotherapistocial Worker (CSW) [facility] via [employer]. SN[facility] has been received. [facility]. [facility] lia[facility] is awre of above. Patient's daughter Eber JonesCarolyn is at bedside and aware of D/C today. Please reconsult if future social work needs arise. CSW signing off.   [employer] Hughes IncorporatedBailey Seila Liston, LCSW [phone]

## 2016-02-27 NOTE — Evaluation (Signed)
Physical Therapy Evaluation Patient Details Name: ALOHA BARTOK MRN: [id number] DOB: 21-Aug-1922 Today's Date: 02/27/2016   History of Present Illness  Pt is a 83 y.o. female presenting to hospital with back pain (pt s/p fall 02/13/16 with questionable L2 compression fx; pt ambulatory with walker prior to fall but now using w/c d/t pain).  Pt admitted with L2 compression fx with about 50 % loss of height; also abdominal pain.  Neurosurgery recommending bracing with TLSO (non-op).  PMH includes dementia, a-fib, DM, and htn.  Clinical Impression  Prior to recent fall, pt was ambulating with walker but now using w/c d/t pain.  Pt lives at [facility].  Currently pt is mod assist to roll to L in bed.  Pt screaming out in pain with any attempt at mobility and raising her R hand at therapist (pt did not move R UE towards therapist though) limiting session.  Pt would benefit from skilled PT to address noted impairments and functional limitations.  Pt does not appear appropriate for STR at this time.  Recommend pt discharge back to prior facility with 24/7 assist and HHPT (pending pt's ability to participate) when medically appropriate.    Follow Up Recommendations Supervision/Assistance - 24 hour;Home health PT    Equipment Recommendations   (per notes pt already has walker and w/c)    Recommendations for Other Services       Precautions / Restrictions Precautions Precautions: Fall;Back Precaution Comments: Maintain TLSO brace Required Braces or Orthoses: Spinal Brace Spinal Brace: Thoracolumbosacral orthotic Restrictions Weight Bearing Restrictions: No      Mobility  Bed Mobility Overal bed mobility: Needs Assistance Bed Mobility: Rolling Rolling: Mod assist         General bed mobility comments: vc's required for UE support on bedrail to assist (limited mobility d/t pt screaming out in pain with logrolling to L side); unable to get pt to assist with trying to sit up  (pt raising R hand/UE multiple times at therapist but did not move R UE towards therapist); 2 assist to boost pt up in bed  Transfers                 General transfer comment: Not appropriate at this time  Ambulation/Gait             General Gait Details: Not appropriate at this time  Stairs            Wheelchair Mobility    Modified Rankin (Stroke Patients Only)       Balance                                             Pertinent Vitals/Pain Pain Assessment: Faces Faces Pain Scale: Hurts worst (2/10 at rest; 10/10 with any attempt at mobility) Pain Location: back Pain Descriptors / Indicators: Grimacing (Yelling out in pain) Pain Intervention(s): Limited activity within patient's tolerance;Monitored during session;Premedicated before session;Repositioned  Vitals (HR and O2 on room air) stable and WFL throughout treatment session.    Home Living Family/patient expects to be discharged to:: Skilled nursing facility                 Additional Comments: [facility] unit    Prior Function Level of Independence: Needs assistance         Comments: Per notes pt ambulatory with walker  prior to fall but now requiring w/c d/t significant back pain.  Total assist with ADL's.     Hand Dominance        Extremity/Trunk Assessment   Upper Extremity Assessment Upper Extremity Assessment: Difficult to assess due to impaired cognition    Lower Extremity Assessment Lower Extremity Assessment: Difficult to assess due to impaired cognition       Communication   Communication: HOH  Cognition Arousal/Alertness: Awake/alert Behavior During Therapy: Impulsive Overall Cognitive Status: No family/caregiver present to determine baseline cognitive functioning (Pt did not answer any questions to determine cognitive status)                      General Comments General comments (skin integrity, edema, etc.): Pt laying  in bed with TLSO already donned upon PT entering room.  Nursing cleared pt for participation in physical therapy.  Pt agreeable to limited PT session.    Exercises     Assessment/Plan    PT Assessment Patient needs continued PT services  PT Problem List Decreased strength;Decreased activity tolerance;Decreased mobility;Pain;Decreased knowledge of precautions          PT Treatment Interventions DME instruction;Gait training;Functional mobility training;Therapeutic activities;Therapeutic exercise;Patient/family education    PT Goals (Current goals can be found in the Care Plan section)  Acute Rehab PT Goals Patient Stated Goal: pt initially verbalized she wanted to get up PT Goal Formulation: With patient Time For Goal Achievement: 03/12/16 Potential to Achieve Goals: Fair    Frequency Min 2X/week   Barriers to discharge        Co-evaluation               End of Session Equipment Utilized During Treatment: Back brace Activity Tolerance: Patient limited by pain Patient left: in bed;with call bell/phone within reach;with bed alarm set;with nursing/sitter in room Nurse Communication: Mobility status;Precautions    Functional Assessment Tool Used: AM-PAC without stairs Functional Limitation: Mobility: Walking and moving around Mobility: Walking and Moving Around Current Status ([id number]): At least 80 percent but less than 100 percent impaired, limited or restricted Mobility: Walking and Moving Around Goal Status [phone]): At least 40 percent but less than 60 percent impaired, limited or restricted    Time: 0920-0940 PT Time Calculation (min) (ACUTE ONLY): 20 min   Charges:   PT Evaluation $PT Eval Low Complexity: 1 Procedure     PT G Codes:   PT G-Codes **NOT FOR INPATIENT CLASS** Functional Assessment Tool Used: AM-PAC without stairs Functional Limitation: Mobility: Walking and moving around Mobility: Walking and Moving Around Current Status ([id number]): At least 80  percent but less than 100 percent impaired, limited or restricted Mobility: Walking and Moving Around Goal Status [phone]): At least 40 percent but less than 60 percent impaired, limited or restricted    [facility] DetroitEmily Auriah Hollings 02/27/2016, 10:05 AM Hendricks LimesEmily Jorita Bohanon, PT [phone]

## 2016-02-28 LAB — URINE CULTURE: Culture: 60000 — AB

## 2016-04-12 DEATH — deceased

## 2018-12-19 IMAGING — CT CT ABD-PELV W/O CM
2 of 4 series · 16 of 46 positions shown, 18 images · non-contrast
Comparison: Same day thoracolumbar spine CT

CLINICAL DATA: Compression fracture of lumbar spine. Addendum
distention with acute tenderness and guarding.

EXAM:
CT ABDOMEN AND PELVIS WITHOUT CONTRAST
TECHNIQUE: Multidetector CT imaging of the abdomen and pelvis was performed
following the standard protocol without IV contrast.

[Series 2: routine abd/pel wo · axial · 0.86mm/px · z∈[-572,-138]mm · 13 of 95 slices shown, 15 images]
[im 4/95  soft-tissue]
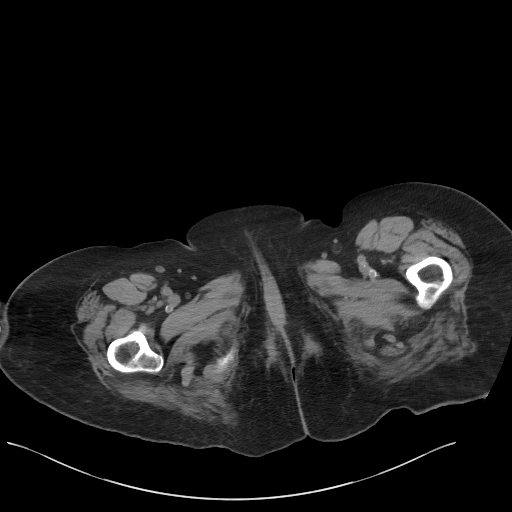
[im 4/95  bone]
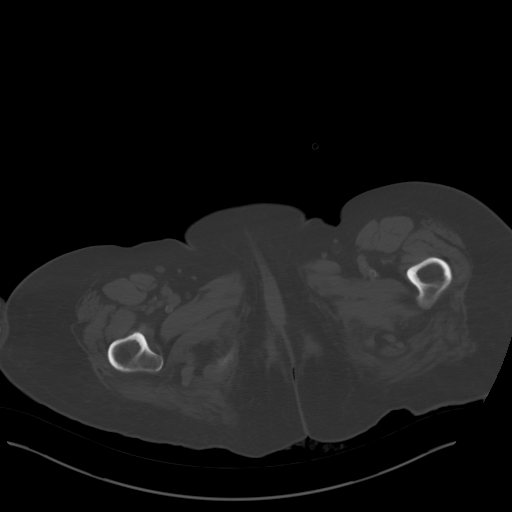
[im 11/95  soft-tissue]
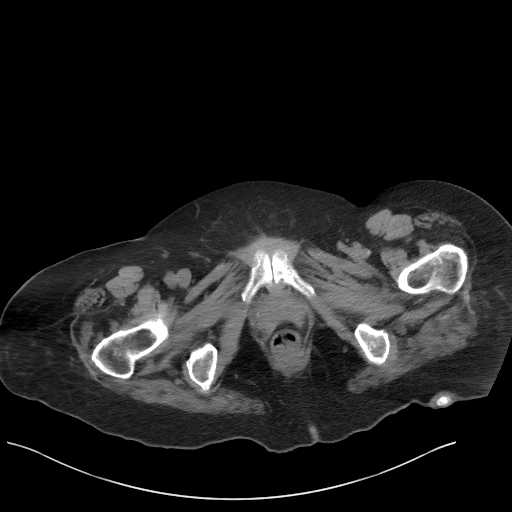
[im 19/95  soft-tissue]
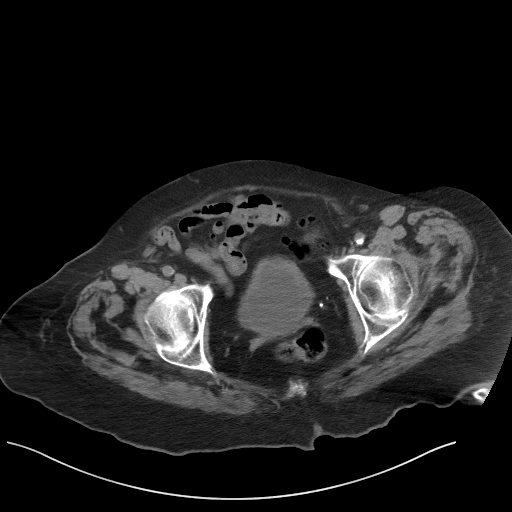
[im 26/95  soft-tissue]
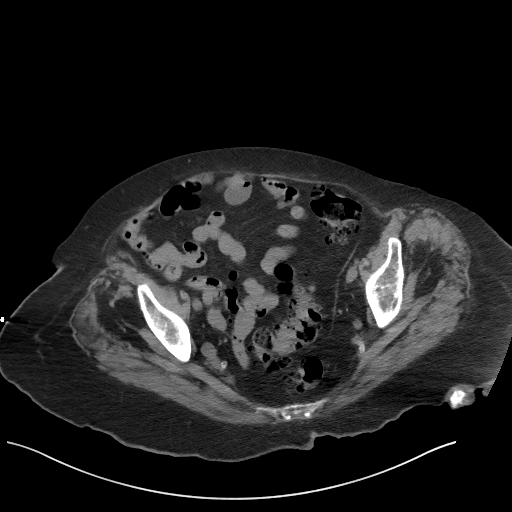
[im 33/95  soft-tissue]
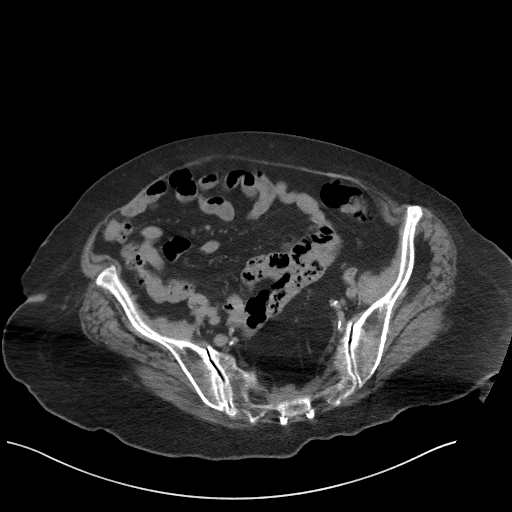
[im 40/95  soft-tissue]
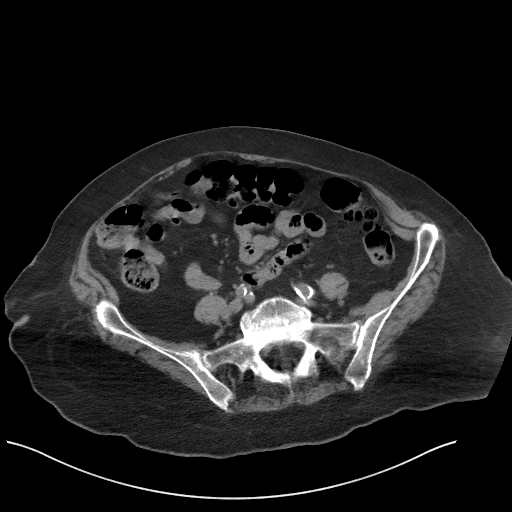
[im 48/95  soft-tissue]
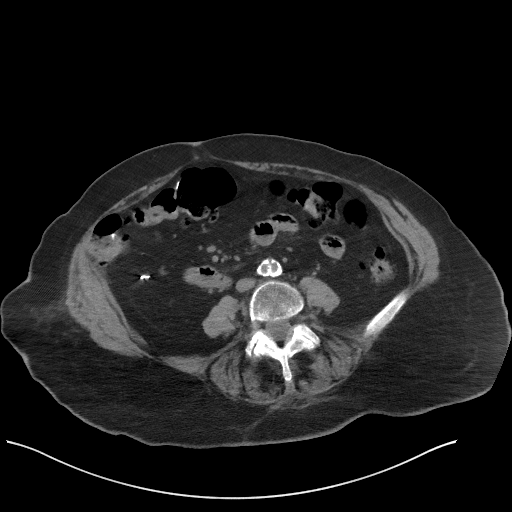
[im 55/95  soft-tissue]
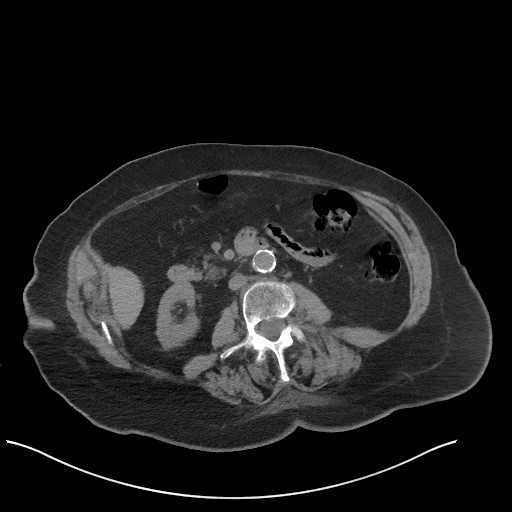
[im 62/95  soft-tissue]
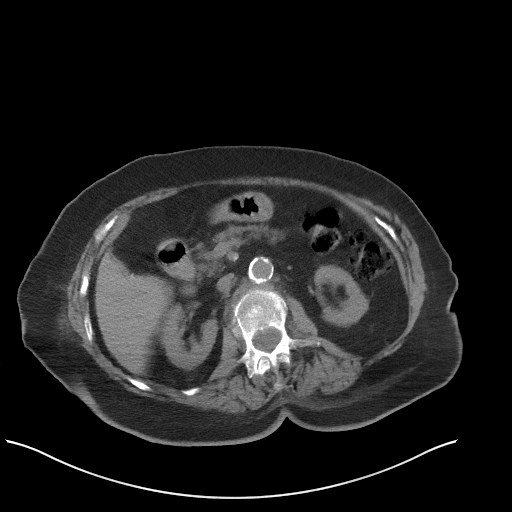
[im 62/95  bone]
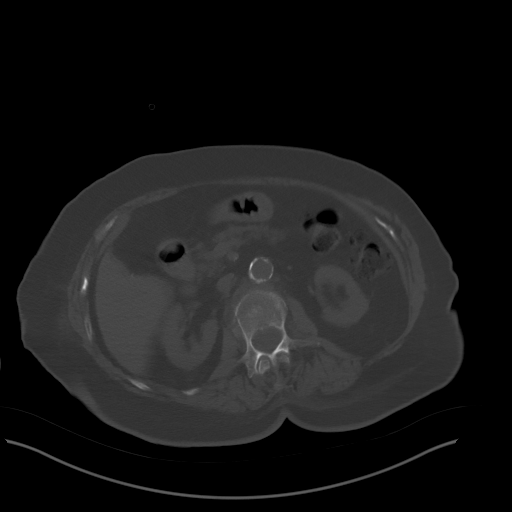
[im 69/95  soft-tissue]
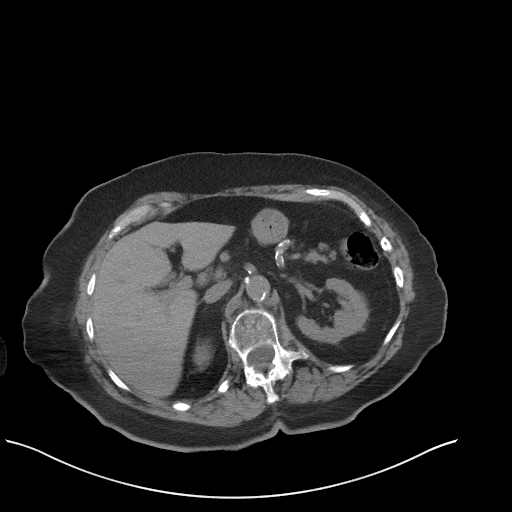
[im 76/95  soft-tissue]
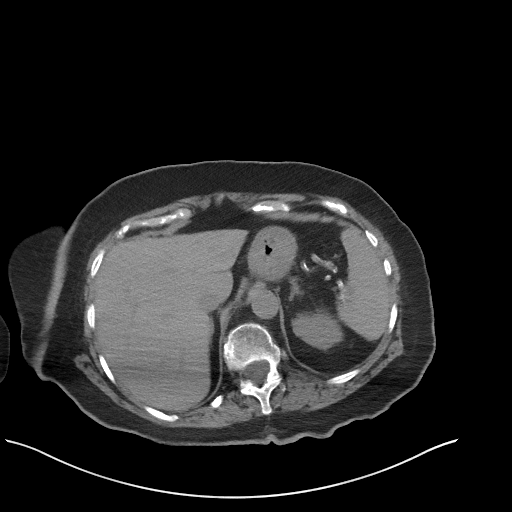
[im 84/95  soft-tissue]
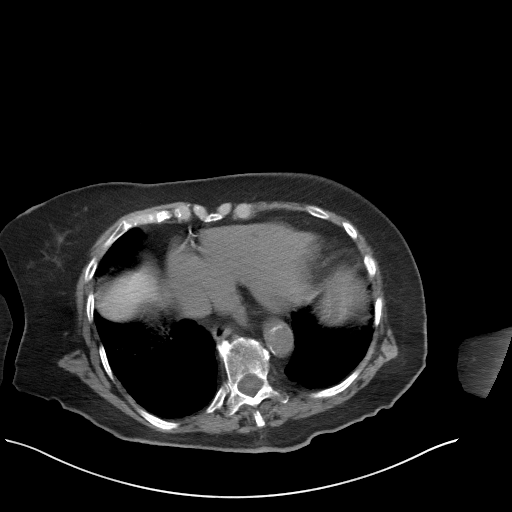
[im 91/95  soft-tissue]
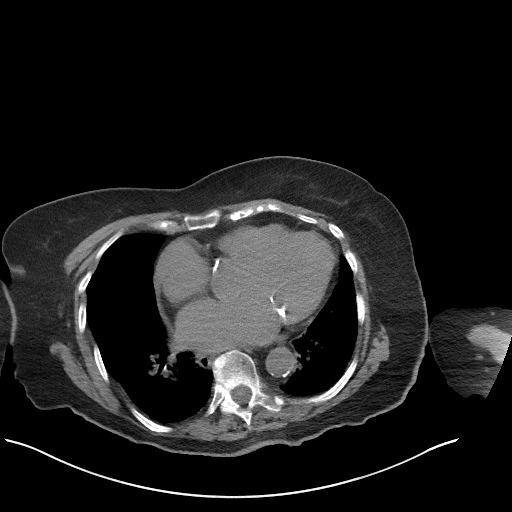

[Series 5: coronal st · coronal · 0.78mm/px · 3 of 84 slices shown]
[im 28/84  soft-tissue]
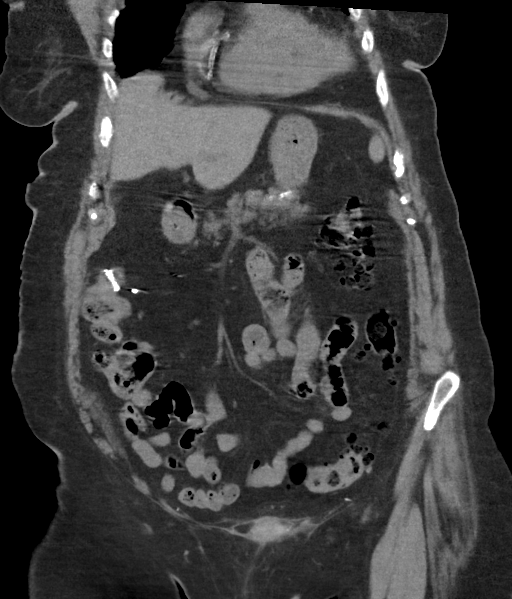
[im 37/84  soft-tissue]
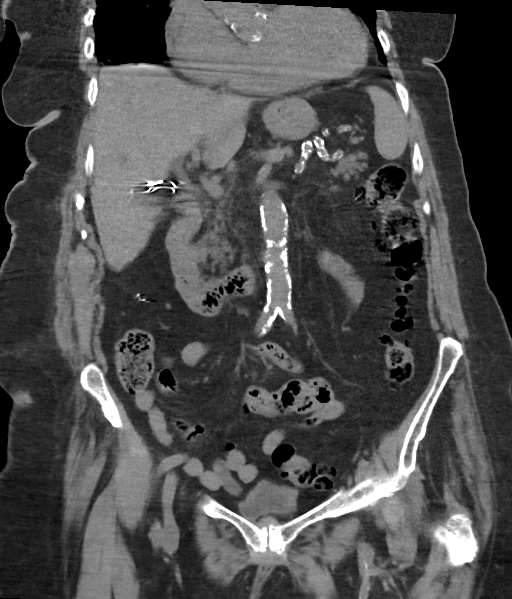
[im 47/84  soft-tissue]
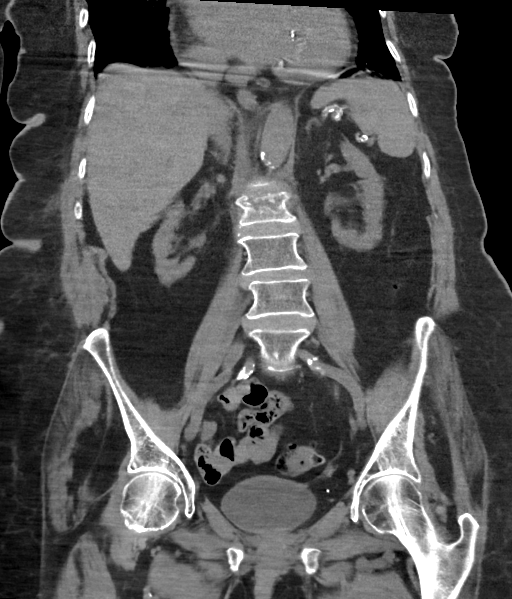

[16 of 46 positions shown; findings below may reference images not displayed]

FINDINGS: Lower chest: Cardiomegaly with coronary arteriosclerosis. No
pericardial effusion. Mild subpleural areas of atelectasis and/or
fibrosis. Faint ground-glass opacities may be secondary to
hypoventilatory change at each lung base.

Hepatobiliary: The unenhanced liver is unremarkable. No biliary
dilatation. The patient is status post cholecystectomy.

Pancreas: No pancreatic mass or ductal dilatation. Mild fatty
atrophy.

Spleen: No splenomegaly or mass.

Adrenals/Urinary Tract: Mild thickening of left adrenal apex. No
adrenal nodule or mass. No nephrolithiasis nor obstructive uropathy.
There appear to be interpolar cysts off the right kidney measuring
1.5 cm anteriorly and 0.8 cm posteriorly.

Stomach/Bowel: The stomach is contracted. There is normal small
bowel rotation. No small bowel dilatation. There is extensive
diverticulosis distal transverse colon sigmoid. No acute
inflammation. No large bowel dilatation. Numerous sutures noted in
the right hemiabdomen. Appendix not visualized. The appendix may be
surgically absent.

Vascular/Lymphatic: Aortic and branch vessel atherosclerosis.
Bilateral renal artery stents at the origins. No aneurysm no
adenopathy by CT criteria.

Reproductive: Hysterectomy.  No adnexal mass.

Other: Small fat containing umbilical hernia. No free air nor free
fluid.

Musculoskeletal: 2 superior endplate compression fracture with 50%
height loss and slight retropulsion of posterosuperior aspect.
Degenerative disc space narrowing L5-S1 with multilevel facet
arthropathy.
IMPRESSION: 1. Compression fracture of L2 with slight retropulsion but without
significant canal stenosis.
2. No acute intra-abdominal or pelvic solid nor hollow visceral
organ abnormality.
3. Extensive colonic diverticulosis without acute diverticulitis. No
bowel obstruction or perforation.
4. Aortic and branch vessel atherosclerosis. Bilateral renal artery
stents.
5. Right-sided interpolar renal cysts the largest 1.5 cm and simple
in appearance.
6. Cholecystectomy.
7. Appendix not visualized and may be surgically absent. No
pericecal inflammation.
8. Cardiomegaly with coronary arteriosclerosis.

## 2018-12-20 IMAGING — CR DG LUMBAR SPINE 2-3V
1 series · 2 of 2 positions shown · non-contrast
Comparison: CT from 02/25/2016

CLINICAL DATA: Lumbar fractured

EXAM:
LUMBAR SPINE - 2-3 VIEW

[Series 1: dg lumbar spine 2-3 views · 0.14mm/px · 2 of 2 slices shown]
[im 1/2]
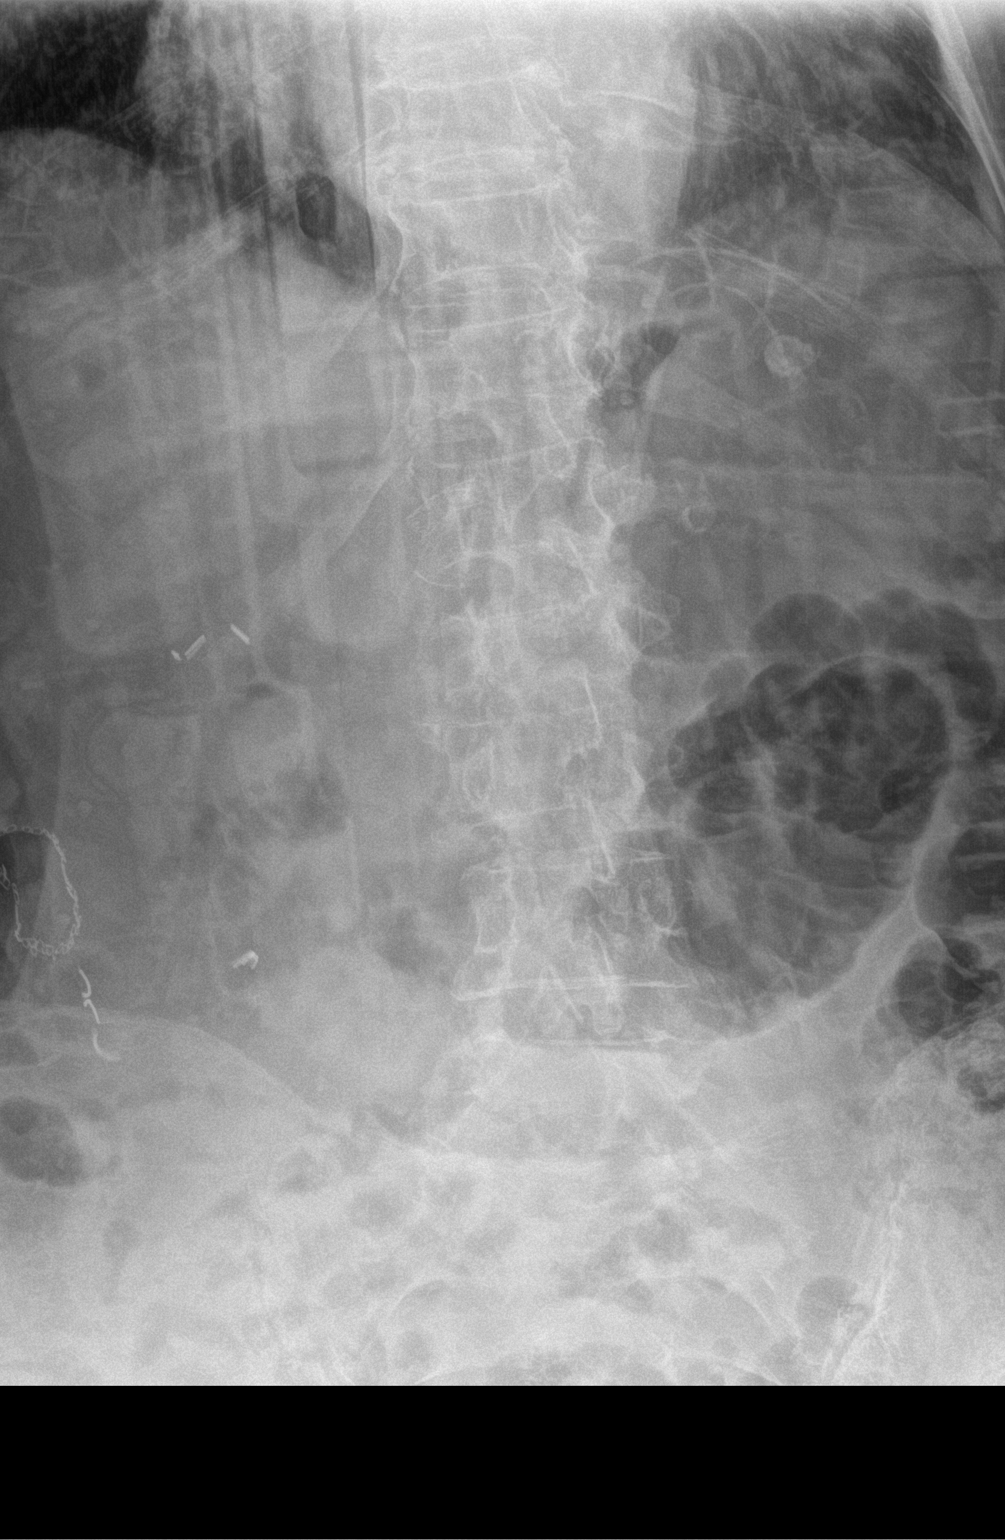
[im 2/2]
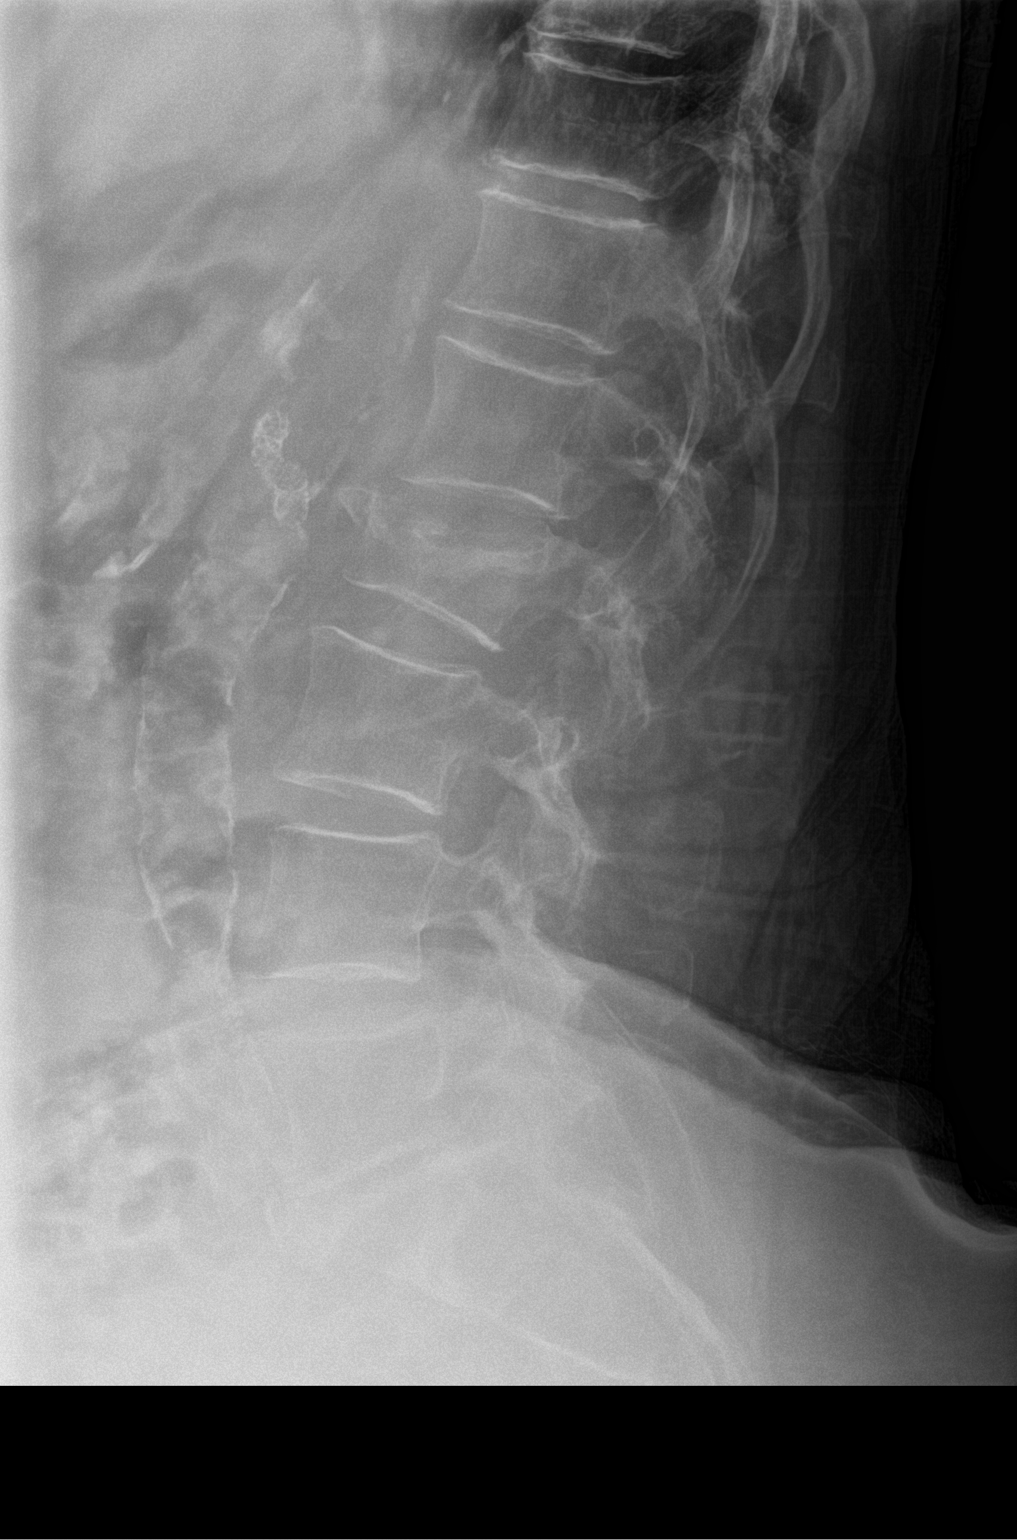

[2 of 2 positions shown; findings below may reference images not displayed]

FINDINGS: The patient has a known superior endplate compression fracture of L2
which appears stable relative to the CT reformatted images of the
lumbar spine from 1 day prior. No significant change in
posterosuperior minimal retropulsion. No new fracture is identified.
There is no spondylolysis nor spondylolisthesis. There is
degenerative disc disease with disc space narrowing at L5-S1. There
is aortic atherosclerosis without aneurysm.
IMPRESSION: 50% compression of the superior endplate of L2 is again noted with
minimal retropulsion of the posterosuperior aspect of the vertebral
body. No further height loss identified.
